# Patient Record
Sex: Female | Born: 2000 | Race: White | Hispanic: No | Marital: Single | Smoking: Never smoker
Health system: Southern US, Community
[De-identification: ages and names within clinical notes are randomized; demographics above are authoritative.]

## PROBLEM LIST (undated history)

## (undated) DIAGNOSIS — F909 Attention-deficit hyperactivity disorder, unspecified type: Secondary | ICD-10-CM

## (undated) DIAGNOSIS — J302 Other seasonal allergic rhinitis: Secondary | ICD-10-CM

## (undated) DIAGNOSIS — F812 Mathematics disorder: Secondary | ICD-10-CM

## (undated) DIAGNOSIS — G43909 Migraine, unspecified, not intractable, without status migrainosus: Secondary | ICD-10-CM

## (undated) DIAGNOSIS — F419 Anxiety disorder, unspecified: Secondary | ICD-10-CM

## (undated) DIAGNOSIS — M5416 Radiculopathy, lumbar region: Secondary | ICD-10-CM

## (undated) DIAGNOSIS — Z91018 Allergy to other foods: Secondary | ICD-10-CM

## (undated) DIAGNOSIS — F32A Depression, unspecified: Secondary | ICD-10-CM

## (undated) DIAGNOSIS — L83 Acanthosis nigricans: Secondary | ICD-10-CM

## (undated) DIAGNOSIS — Z87891 Personal history of nicotine dependence: Secondary | ICD-10-CM

## (undated) DIAGNOSIS — Z91010 Allergy to peanuts: Secondary | ICD-10-CM

## (undated) DIAGNOSIS — F329 Major depressive disorder, single episode, unspecified: Secondary | ICD-10-CM

## (undated) DIAGNOSIS — E669 Obesity, unspecified: Secondary | ICD-10-CM

## (undated) DIAGNOSIS — J309 Allergic rhinitis, unspecified: Secondary | ICD-10-CM

## (undated) DIAGNOSIS — N926 Irregular menstruation, unspecified: Secondary | ICD-10-CM

## (undated) DIAGNOSIS — F988 Other specified behavioral and emotional disorders with onset usually occurring in childhood and adolescence: Secondary | ICD-10-CM

## (undated) HISTORY — DX: Allergy to other foods: Z91.018

## (undated) HISTORY — DX: Radiculopathy, lumbar region: M54.16

## (undated) HISTORY — DX: Obesity, unspecified: E66.9

## (undated) HISTORY — DX: Allergy to peanuts: Z91.010

## (undated) HISTORY — DX: Personal history of nicotine dependence: Z87.891

## (undated) HISTORY — PX: WISDOM TOOTH EXTRACTION: SHX21

## (undated) HISTORY — DX: Acanthosis nigricans: L83

## (undated) HISTORY — PX: LUMBAR MICRODISCECTOMY: SHX99

## (undated) HISTORY — DX: Irregular menstruation, unspecified: N92.6

## (undated) HISTORY — DX: Mathematics disorder: F81.2

## (undated) HISTORY — DX: Other specified behavioral and emotional disorders with onset usually occurring in childhood and adolescence: F98.8

## (undated) HISTORY — DX: Allergic rhinitis, unspecified: J30.9

## (undated) NOTE — Progress Notes (Signed)
 Formatting of this note might be different from the original. Pt presents left eye redness and discharge. Also report congestion, productive cough, rhinorrhea x 2 days. Denies fever. Pt states she had same symptoms last month and was treated for respiratory infection. Reports she was seen twice and treated with doxycycline , oral prednisone , steroid inhaler, and steroid injection.  Hx of asthma. Electronically signed by Damien Batter, CMA at 05/22/2018 12:54 PM EST

## (undated) NOTE — Progress Notes (Signed)
 Formatting of this note is different from the original. Subjective:    Patient ID: Taylor Rose  MRN:  [id number] Date of Birth:  Feb 17, 2001  Patient is a 56 y.o. female who complains of:  Acute Visit . HPI  Pt presents left eye redness and discharge. Also report congestion, productive cough, rhinorrhea x 2 days. Denies fever. Pt states she had same symptoms last month and was treated for respiratory infection. Reports she was seen twice and treated with doxycycline , oral prednisone , steroid inhaler, and steroid injection.  Hx of asthma.  I have reviewed the patient's past medical history, social history, family history, medications and allergies, and made changes when deemed necessary.  Review of Systems  Constitutional: Negative for activity change, appetite change, chills, diaphoresis, fatigue, fever and unexpected weight change.  HENT: Positive for congestion and rhinorrhea. Negative for ear discharge, ear pain, facial swelling, hearing loss, postnasal drip, sinus pressure, sinus pain, sneezing, sore throat, tinnitus and voice change.   Eyes: Positive for discharge and redness. Negative for photophobia, pain, itching and visual disturbance.  Respiratory: Positive for cough and wheezing. Negative for chest tightness and shortness of breath.   Cardiovascular: Negative for chest pain, palpitations and leg swelling.  Gastrointestinal: Negative for abdominal pain, blood in stool, constipation, diarrhea, nausea and vomiting.  Genitourinary: Negative for dysuria, flank pain, frequency, hematuria and urgency.  Musculoskeletal: Negative for arthralgias, back pain, joint swelling, neck pain and neck stiffness.  Skin: Negative for rash.  Neurological: Negative for dizziness, tremors, speech difficulty, weakness, light-headedness, numbness and headaches.  Hematological: Negative for adenopathy. Does not bruise/bleed easily.  Psychiatric/Behavioral: Negative for confusion.  All other systems  reviewed and are negative.   Objective:   BP 116/80 (BP Location: Left upper arm, Patient Position: Sitting, BP Cuff Size: Large adult)   Pulse 80   Temp 98.1 F (36.7 C) (Oral)   Resp 14   Wt 196 lb (88.9 kg)   SpO2 99%   Physical Exam  Constitutional: Vital signs are normal. She appears well-developed and well-nourished.  Non-toxic appearance. She does not have a sickly appearance. She does not appear ill. No distress.  HENT:  Head: Normocephalic and atraumatic.  Right Ear: External ear normal. No mastoid tenderness. Tympanic membrane is not injected, not erythematous and not bulging. A middle ear effusion is present.  Left Ear: External ear normal. No mastoid tenderness. Tympanic membrane is not injected, not erythematous and not bulging. A middle ear effusion is present.  Nose: Nose normal. Right sinus exhibits no maxillary sinus tenderness and no frontal sinus tenderness. Left sinus exhibits no maxillary sinus tenderness and no frontal sinus tenderness.  Mouth/Throat: Uvula is midline, oropharynx is clear and moist and mucous membranes are normal. No oropharyngeal exudate, posterior oropharyngeal edema, posterior oropharyngeal erythema or tonsillar abscesses.  Post nasal drip   Eyes: Pupils are equal, round, and reactive to light. Conjunctivae and EOM are normal. Right eye exhibits no discharge. Left eye exhibits discharge. No scleral icterus.  Neck: Normal range of motion. Neck supple.  Cardiovascular: Normal rate, regular rhythm and normal heart sounds. Exam reveals no gallop and no friction rub.  No murmur heard. Pulmonary/Chest: Effort normal. No respiratory distress. She has wheezes. She has no rales.  Abdominal: Soft. Bowel sounds are normal. She exhibits no distension. There is no tenderness. There is no rebound and no guarding.  Lymphadenopathy:    She has no cervical adenopathy.  Neurological: She is alert. Gait normal.  Skin: Skin is warm and  dry. No rash noted. She is  not diaphoretic. No erythema.  Psychiatric: She has a normal mood and affect. Her behavior is normal. Judgment and thought content normal.  Nursing note and vitals reviewed.     Assessment & Plan    Conjunctivitis - will treat with erythromycin ointment today. No concerns for preseptal or orbital cellulitis. RTC or go to eye doctor with worsening s/s.   Asthma - will give trial of montelukast  today.    Electronically signed by Scot Toribio Gave, PA-C at 05/22/2018 12:54 PM EST

---
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
## 2018-05-08 ENCOUNTER — Ambulatory Visit (HOSPITAL_COMMUNITY)
Admission: EM | Admit: 2018-05-08 | Discharge: 2018-05-08 | Disposition: A | Discharge status: Home / Self Care | Payer: BC Managed Care – PPO | Attending: Physician Assistant | Admitting: Physician Assistant

## 2018-05-08 ENCOUNTER — Encounter (HOSPITAL_COMMUNITY): Payer: Self-pay

## 2018-05-08 DIAGNOSIS — J0140 Acute pansinusitis, unspecified: Secondary | ICD-10-CM | POA: Diagnosis not present

## 2018-05-08 DIAGNOSIS — J4521 Mild intermittent asthma with (acute) exacerbation: Secondary | ICD-10-CM

## 2018-05-08 MED ORDER — DOXYCYCLINE HYCLATE 100 MG PO CAPS: 100.0000 mg | ORAL_CAPSULE | Freq: Two times a day (BID) | ORAL | 0 refills | Status: DC

## 2018-05-08 MED ORDER — IPRATROPIUM BROMIDE 0.06 % NA SOLN: 2.0000 | Freq: Four times a day (QID) | NASAL | 0 refills | Status: DC

## 2018-05-08 MED ORDER — PREDNISONE 50 MG PO TABS: 50.0000 mg | ORAL_TABLET | Freq: Every day | ORAL | 0 refills | Status: DC

## 2018-05-08 NOTE — ED Triage Notes (Signed)
Pt presents with increase in asthma flare up in past week; pt states that she has had a bad cough that is unrelieved with medication.  Pt also complains of redness and irritation with left eye.

## 2018-05-08 NOTE — Discharge Instructions (Signed)
Start doxycycline and prednisone as directed. Start atrovent nasal spray for nasal congestion/drainage. You can use over the counter nasal saline rinse such as neti pot for nasal congestion. Keep hydrated, your urine should be clear to pale yellow in color. Tylenol/motrin for fever and pain. Monitor for any worsening of symptoms, chest pain, shortness of breath, wheezing, swelling of the throat, follow up for reevaluation.   As discussed, your eye redness could be due to current respiratory infection. Warm compress to the eye and monitor symptoms.  Monitor for any worsening of symptoms, changes in vision, sensitivity to light, eye swelling, painful eye movement, follow up with ophthalmology for further evaluation.

## 2018-05-08 NOTE — ED Provider Notes (Signed)
[facility]    CSN: [id number] Arrival date & time: 05/08/18  1200     History   Chief Complaint Chief Complaint  Patient presents with  . Asthma  . Conjunctivitis  . Cough    HPI Taylor Rose is a 18 y.o. female.   18 year old female with history of asthma comes in for continued URI symptoms and asthma exacerbation. Patient was seen at an urgency care 12/20 for similar symptoms, at that time exam was consistent with viral URI and was given tessalon for symptoms.  Patient has been taking as directed without much improvement.  She continues to have rhinorrhea, nasal congestion.  States cough periodically at nighttime.  Denies fever, chills, night sweats.  She has had to increase use of albuterol inhaler, but this completely resolves wheezing or shortness of breath.  She woke up this morning with left eye redness and crusting.  Denies eye pain, irritation.  Denies photophobia, vision changes.  Denies contact lens, glasses use.     Past Medical History:  Diagnosis Date  . Asthma   . Migraine   . Nut allergy   . Seasonal allergies     Patient Active Problem List   Diagnosis Date Noted  . 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 Migraine without aura and without status migrainosus, not intractable 11/12/2014  . Episodic tension-type headache, not intractable 11/12/2014  . 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 Irregular periods/menstrual cycles 11/12/2014  . Acanthosis nigricans 11/12/2014  . Obesity 11/12/2014    History reviewed. No pertinent surgical history.  OB History   No obstetric history on file.      Home Medications    Prior to Admission medications   Medication Sig Start Date End Date Taking? Authorizing Provider  albuterol (PROVENTIL HFA) 108 (90 Base) MCG/ACT inhaler Inhale 2 puffs into the lungs every 6 (six) hours as needed for wheezing or shortness of breath. 07/17/15   Kozlow, Alvira Philips,  MD  albuterol (PROVENTIL) (2.5 MG/3ML) 0.083% nebulizer solution Take 3 mLs (2.5 mg total) by nebulization every 4 (four) hours as needed for wheezing. 05/08/17   Alene Mires, NP  aspirin-acetaminophen-caffeine (EXCEDRIN MIGRAINE) [phone] MG per tablet Take 1 tablet by mouth every 6 (six) hours as needed for headache or migraine.    [provider]  cetirizine (ZYRTEC) 10 MG tablet Take 10 mg by mouth daily.    [provider]  diphenhydrAMINE (BENADRYL) 50 MG capsule Take 1 cap PO Q6h x 1-2 days then Q6h prn 03/23/15   Lowanda Foster, NP  doxycycline (VIBRAMYCIN) 100 MG capsule Take 1 capsule (100 mg total) by mouth 2 (two) times daily. 05/08/18   Cathie Hoops, Alece Koppel V, PA-C  EPINEPHrine (EPIPEN 2-PAK) 0.3 mg/0.3 mL IJ SOAJ injection Inject 0.3 mLs (0.3 mg total) into the muscle once. 07/17/15   Kozlow, Alvira Philips, MD  etonogestrel (NEXPLANON) 68 MG IMPL implant 1 each by Subdermal route once. 12/30/14   [provider]  famotidine (PEPCID) 40 MG tablet Take 1 tablet (40 mg total) by mouth daily. X 4 days then QD prn 03/23/15   Lowanda Foster, NP  Ibuprofen 200 MG CAPS Take 1 capsule (200 mg total) by mouth 3 (three) times daily with meals as needed. 12/01/12   Moreno-Coll, Adlih, MD  ipratropium (ATROVENT) 0.06 % nasal spray Place 2 sprays into both nostrils 4 (four) times daily. 05/08/18   Belinda Fisher, PA-C  methylphenidate 36 MG PO CR tablet  01/18/15   [provider]  ondansetron (ZOFRAN) 4 MG tablet Take 1 tablet (4 mg total) by mouth every 6 (six) hours. 05/08/17   Alene Mires, NP  predniSONE (DELTASONE) 50 MG tablet Take 1 tablet (50 mg total) by mouth daily. 05/08/18   Belinda Fisher, PA-C  topiramate (TOPAMAX) 25 MG tablet Take one tablet at nighttime for one week then 2 tablets at nighttime 01/30/15   Deetta Perla, MD    Family History History reviewed. No pertinent family history.  Social History Social History   Tobacco Use  . Smoking status: Never  Smoker  . Smokeless tobacco: Never Used  Substance Use Topics  . Alcohol use: No  . Drug use: No     Allergies   Peanuts [peanut oil]; Amoxicillin; and Penicillins   Review of Systems Review of Systems  Reason unable to perform ROS: See HPI as above.     Physical Exam Triage Vital Signs ED Triage Vitals  Enc Vitals Group     BP 05/08/18 1304 (!) 146/95     Pulse Rate 05/08/18 1304 82     Resp 05/08/18 1304 18     Temp 05/08/18 1304 98 F (36.7 C)     Temp Source 05/08/18 1304 Oral     SpO2 05/08/18 1304 91 %     Weight --      Height --      Head Circumference --      Peak Flow --      Pain Score 05/08/18 1306 5     Pain Loc --      Pain Edu? --      Excl. in GC? --    No data found.  Updated Vital Signs BP (!) 146/95 (BP Location: Right Arm)   Pulse 82   Temp 98 F (36.7 C) (Oral)   Resp 18   SpO2 98%   Physical Exam Constitutional:      General: She is not in acute distress.    Appearance: She is well-developed. She is not ill-appearing, toxic-appearing or diaphoretic.  HENT:     Head: Normocephalic and atraumatic.     Right Ear: Tympanic membrane, ear canal and external ear normal. Tympanic membrane is not erythematous or bulging.     Left Ear: Tympanic membrane, ear canal and external ear normal. Tympanic membrane is not erythematous or bulging.     Nose: Congestion and rhinorrhea present.     Right Sinus: No maxillary sinus tenderness or frontal sinus tenderness.     Left Sinus: No maxillary sinus tenderness or frontal sinus tenderness.     Mouth/Throat:     Pharynx: Uvula midline.  Eyes:     General: Lids are normal. Lids are everted, no foreign bodies appreciated.     Extraocular Movements: Extraocular movements intact.     Conjunctiva/sclera:     Right eye: Right conjunctiva is not injected.     Left eye: Left conjunctiva is injected.     Pupils: Pupils are equal, round, and reactive to light.  Neck:     Musculoskeletal: Normal range of  motion and neck supple.  Cardiovascular:     Rate and Rhythm: Normal rate and regular rhythm.     Heart sounds: Normal heart sounds. No murmur. No friction rub. No gallop.   Pulmonary:     Effort: Pulmonary effort is normal.     Breath sounds: Normal breath sounds. No decreased breath sounds, wheezing, rhonchi or rales.  Lymphadenopathy:     Cervical: No cervical adenopathy.  Skin:    General: Skin is warm and dry.  Neurological:     Mental Status: She is alert and oriented to person, place, and time.  Psychiatric:        Behavior: Behavior normal.        Judgment: Judgment normal.      UC Treatments / Results  Labs (all labs ordered are listed, but only abnormal results are displayed) Labs Reviewed - No data to display  EKG None  Radiology No results found.  Procedures Procedures (including critical care time)  Medications Ordered in UC Medications - No data to display  Initial Impression / Assessment and Plan / UC Course  I have reviewed the triage vital signs and the nursing notes.  Pertinent labs & imaging results that were available during my care of the patient were reviewed by me and considered in my medical decision making (see chart for details).    Patient initial triage with O2 saturation of 91% at room air.  At this time, patient was speaking in full sentences without difficulty and denies shortness of breath and wheezing.  Recheck oxygen saturation at 98% at room air.  Patient with acrylic nails, discussed with patient nails could be obscuring providers.  Lungs are clear to auscultation bilaterally without adventitious lung sounds.  No signs of respiratory distress.  Will cover for sinusitis given 2 to 3-week history of symptoms.  Doxycycline as directed.  Other symptomatic treatment discussed.  Will provide prednisone for asthma exacerbation.  Discussed with patient, eye redness could be due to current respiratory infection, will have patient continue to  monitor for now.  Return precautions given.  Patient expresses understanding and agrees to plan.  Final Clinical Impressions(s) / UC Diagnoses   Final diagnoses:  Acute non-recurrent pansinusitis  Mild intermittent asthma with acute exacerbation    ED Prescriptions    Medication Sig Dispense Auth. Provider   predniSONE (DELTASONE) 50 MG tablet Take 1 tablet (50 mg total) by mouth daily. 5 tablet Jeiry Birnbaum V, PA-C   doxycycline (VIBRAMYCIN) 100 MG capsule Take 1 capsule (100 mg total) by mouth 2 (two) times daily. 20 capsule Divante Kotch V, PA-C   ipratropium (ATROVENT) 0.06 % nasal spray Place 2 sprays into both nostrils 4 (four) times daily. 15 mL Threasa AlphaYu, Rhett Mutschler V, PA-C        Tyronn Golda V, PA-C 05/08/18 1400

## 2018-05-13 ENCOUNTER — Encounter (HOSPITAL_COMMUNITY): Payer: Self-pay

## 2018-05-13 ENCOUNTER — Ambulatory Visit (HOSPITAL_COMMUNITY)
Admission: EM | Admit: 2018-05-13 | Discharge: 2018-05-13 | Disposition: A | Discharge status: Home / Self Care | Payer: BC Managed Care – PPO | Attending: Family Medicine | Admitting: Family Medicine

## 2018-05-13 DIAGNOSIS — J4541 Moderate persistent asthma with (acute) exacerbation: Secondary | ICD-10-CM | POA: Diagnosis not present

## 2018-05-13 MED ORDER — METHYLPREDNISOLONE ACETATE 80 MG/ML IJ SUSP
INTRAMUSCULAR | Status: AC
  Filled 2018-05-13: qty 1

## 2018-05-13 MED ORDER — METHYLPREDNISOLONE ACETATE 80 MG/ML IJ SUSP
80.0000 mg | Freq: Once | INTRAMUSCULAR | Status: AC
  Administered 2018-05-13: 80 mg via INTRAMUSCULAR

## 2018-05-13 MED ORDER — FLUTICASONE PROPIONATE HFA 110 MCG/ACT IN AERO: 2.0000 | INHALATION_SPRAY | Freq: Two times a day (BID) | RESPIRATORY_TRACT | 12 refills | Status: DC | PRN

## 2018-05-13 NOTE — ED Provider Notes (Signed)
[facility]    CSN: [id number] Arrival date & time: 05/13/18  1437     History   Chief Complaint Chief Complaint  Patient presents with  . Appointment    245  . Cough    HPI Taylor Rose is a 18 y.o. female.   This a 18 year old woman who comes in 5 days after her last visit.  She stating that she completed her antibiotics and is concerned about returning symptoms.    Please see note from 05/08/2018: 18 year old female with history of asthma comes in for continued URI symptoms and asthma exacerbation. Patient was seen at an urgency care 12/20 for similar symptoms, at that time exam was consistent with viral URI and was given tessalon for symptoms.  Patient has been taking as directed without much improvement.  She continues to have rhinorrhea, nasal congestion.  States cough periodically at nighttime.  Denies fever, chills, night sweats.  She has had to increase use of albuterol inhaler, but this completely resolves wheezing or shortness of breath.  She woke up this morning with left eye redness and crusting.  Denies eye pain, irritation.  Denies photophobia, vision changes.  Denies contact lens, glasses use.     Past Medical History:  Diagnosis Date  . Asthma   . Migraine   . Nut allergy   . Seasonal allergies     Patient Active Problem List   Diagnosis Date Noted  . 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 Migraine without aura and without status migrainosus, not intractable 11/12/2014  . Episodic tension-type headache, not intractable 11/12/2014  . 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 Irregular periods/menstrual cycles 11/12/2014  . Acanthosis nigricans 11/12/2014  . Obesity 11/12/2014    History reviewed. No pertinent surgical history.  OB History   No obstetric history on file.      Home Medications    Prior to Admission medications   Medication Sig Start Date End Date Taking? Authorizing  Provider  albuterol (PROVENTIL HFA) 108 (90 Base) MCG/ACT inhaler Inhale 2 puffs into the lungs every 6 (six) hours as needed for wheezing or shortness of breath. 07/17/15   Kozlow, Alvira Philips, MD  albuterol (PROVENTIL) (2.5 MG/3ML) 0.083% nebulizer solution Take 3 mLs (2.5 mg total) by nebulization every 4 (four) hours as needed for wheezing. 05/08/17   Alene Mires, NP  aspirin-acetaminophen-caffeine (EXCEDRIN MIGRAINE) [id number] MG per tablet Take 1 tablet by mouth every 6 (six) hours as needed for headache or migraine.    [provider]  diphenhydrAMINE (BENADRYL) 50 MG capsule Take 1 cap PO Q6h x 1-2 days then Q6h prn 03/23/15   Lowanda Foster, NP  EPINEPHrine (EPIPEN 2-PAK) 0.3 mg/0.3 mL IJ SOAJ injection Inject 0.3 mLs (0.3 mg total) into the muscle once. 07/17/15   Kozlow, Alvira Philips, MD  etonogestrel (NEXPLANON) 68 MG IMPL implant 1 each by Subdermal route once. 12/30/14   [provider]  fluticasone (FLOVENT HFA) 110 MCG/ACT inhaler Inhale 2 puffs into the lungs 2 (two) times daily as needed. 05/13/18   Elvina Sidle, MD  Ibuprofen 200 MG CAPS Take 1 capsule (200 mg total) by mouth 3 (three) times daily with meals as needed. 12/01/12   Moreno-Coll, Adlih, MD  methylphenidate 36 MG PO CR tablet  01/18/15   [provider]    Family History History reviewed. No pertinent family history.  Social History Social History   Tobacco Use  . Smoking status: Never Smoker  .  Smokeless tobacco: Never Used  Substance Use Topics  . Alcohol use: No  . Drug use: No     Allergies   Peanuts [peanut oil]; Amoxicillin; and Penicillins   Review of Systems Review of Systems   Physical Exam Triage Vital Signs ED Triage Vitals [05/13/18 1452]  Enc Vitals Group     BP 128/81     Pulse Rate 99     Resp 18     Temp (!) 97 F (36.1 C)     Temp Source Oral     SpO2 98 %     Weight      Height      Head Circumference      Peak Flow      Pain Score 0     Pain Loc       Pain Edu?      Excl. in GC?    No data found.  Updated Vital Signs BP 128/81 (BP Location: Right Arm)   Pulse 99   Temp (!) 97 F (36.1 C) (Oral)   Resp 18   SpO2 98%    Physical Exam Vitals signs and nursing note reviewed.  Constitutional:      Appearance: Normal appearance.  HENT:     Head: Normocephalic.     Nose: Congestion present.     Mouth/Throat:     Mouth: Mucous membranes are moist.     Pharynx: Oropharynx is clear.  Eyes:     Conjunctiva/sclera: Conjunctivae normal.     Pupils: Pupils are equal, round, and reactive to light.  Cardiovascular:     Heart sounds: Normal heart sounds.  Pulmonary:     Breath sounds: Wheezing present.  Musculoskeletal: Normal range of motion.  Skin:    General: Skin is warm and dry.  Neurological:     General: No focal deficit present.     Mental Status: She is alert and oriented to person, place, and time.      UC Treatments / Results  Labs (all labs ordered are listed, but only abnormal results are displayed) Labs Reviewed - No data to display  EKG None  Radiology No results found.  Procedures Procedures (including critical care time)  Medications Ordered in UC Medications  methylPREDNISolone acetate (DEPO-MEDROL) injection 80 mg (has no administration in time range)    Initial Impression / Assessment and Plan / UC Course  I have reviewed the triage vital signs and the nursing notes.  Pertinent labs & imaging results that were available during my care of the patient were reviewed by me and considered in my medical decision making (see chart for details).     Final Clinical Impressions(s) / UC Diagnoses   Final diagnoses:  Moderate persistent asthma with exacerbation   Discharge Instructions   None    ED Prescriptions    Medication Sig Dispense Auth. Provider   fluticasone (FLOVENT HFA) 110 MCG/ACT inhaler Inhale 2 puffs into the lungs 2 (two) times daily as needed. 1 Inhaler Elvina Sidle,  MD     Controlled Substance Prescriptions [place] Controlled Substance Registry consulted? Not Applicable   Elvina Sidle, MD 05/13/18 [phone]

## 2018-05-13 NOTE — ED Triage Notes (Signed)
Pt states tx'd for a URI last week, completed meds yesterday and now her sx's are back; c/o asthma flare up and cough

## 2018-12-01 ENCOUNTER — Other Ambulatory Visit: Payer: Self-pay

## 2018-12-01 DIAGNOSIS — Z20822 Contact with and (suspected) exposure to covid-19: Secondary | ICD-10-CM

## 2018-12-02 ENCOUNTER — Other Ambulatory Visit: Payer: Self-pay

## 2018-12-04 LAB — NOVEL CORONAVIRUS, NAA: SARS-CoV-2, NAA: NOT DETECTED
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
## 2019-04-29 ENCOUNTER — Other Ambulatory Visit: Payer: Self-pay

## 2019-04-29 ENCOUNTER — Telehealth (HOSPITAL_COMMUNITY): Payer: Self-pay | Admitting: Urgent Care

## 2019-04-29 ENCOUNTER — Encounter (HOSPITAL_COMMUNITY): Payer: Self-pay | Admitting: *Deleted

## 2019-04-29 ENCOUNTER — Ambulatory Visit (HOSPITAL_COMMUNITY)
Admission: EM | Admit: 2019-04-29 | Discharge: 2019-04-29 | Disposition: A | Discharge status: Home / Self Care | Payer: BC Managed Care – PPO

## 2019-04-29 DIAGNOSIS — Z8616 Personal history of COVID-19: Secondary | ICD-10-CM

## 2019-04-29 DIAGNOSIS — J4540 Moderate persistent asthma, uncomplicated: Secondary | ICD-10-CM | POA: Diagnosis not present

## 2019-04-29 DIAGNOSIS — J3089 Other allergic rhinitis: Secondary | ICD-10-CM

## 2019-04-29 HISTORY — DX: Depression, unspecified: F32.A

## 2019-04-29 HISTORY — DX: Anxiety disorder, unspecified: F41.9

## 2019-04-29 MED ORDER — ALBUTEROL SULFATE (2.5 MG/3ML) 0.083% IN NEBU: 2.5000 mg | INHALATION_SOLUTION | RESPIRATORY_TRACT | 0 refills | Status: AC | PRN

## 2019-04-29 MED ORDER — PREDNISONE 20 MG PO TABS: ORAL_TABLET | ORAL | 0 refills | Status: DC

## 2019-04-29 MED ORDER — ALBUTEROL SULFATE (2.5 MG/3ML) 0.083% IN NEBU: 2.5000 mg | INHALATION_SOLUTION | RESPIRATORY_TRACT | 0 refills | Status: DC | PRN

## 2019-04-29 MED ORDER — NEBULIZER/TUBING/MOUTHPIECE KIT: PACK | 0 refills | Status: AC

## 2019-04-29 NOTE — ED Provider Notes (Signed)
[place]   MRN: [id number] DOB: October 27, 2000  Subjective:   Taylor Rose is a 18 y.o. female presenting for several day history of recurrent wheezing and sinus congestion.  Patient usually has a difficult time during the season.  She has a history of allergic rhinitis, moderate persistent asthma.  She is not currently using her [place].  She also ran out of her nebulized albuterol.  She is using her albuterol inhaler 2-3 times a day.  Of note, patient had Covid at the end of September 2020 and recovered well.  Patient got tested for COVID-19 again 2 days ago and was negative.  No current facility-administered medications for this encounter.  Current Outpatient Medications:  .  albuterol (PROVENTIL HFA) 108 (90 Base) MCG/ACT inhaler, Inhale 2 puffs into the lungs every 6 (six) hours as needed for wheezing or shortness of breath., Disp: 1 Inhaler, Rfl: 1 .  venlafaxine (EFFEXOR) 75 MG tablet, Take 75 mg by mouth daily., Disp: , Rfl:  .  albuterol (PROVENTIL) (2.5 MG/3ML) 0.083% nebulizer solution, Take 3 mLs (2.5 mg total) by nebulization every 4 (four) hours as needed for wheezing., Disp: 75 vial, Rfl: 3 .  aspirin-acetaminophen-caffeine (EXCEDRIN MIGRAINE) 250-250-65 MG per tablet, Take 1 tablet by mouth every 6 (six) hours as needed for headache or migraine., Disp: , Rfl:  .  diphenhydrAMINE (BENADRYL) 50 MG capsule, Take 1 cap PO Q6h x 1-2 days then Q6h prn, Disp: 30 capsule, Rfl: 0 .  EPINEPHrine (EPIPEN 2-PAK) 0.3 mg/0.3 mL IJ SOAJ injection, Inject 0.3 mLs (0.3 mg total) into the muscle once., Disp: 2 Device, Rfl: 1 .  etonogestrel (NEXPLANON) 68 MG IMPL implant, 1 each by Subdermal route once., Disp: , Rfl:  .  fluticasone (FLOVENT HFA) 110 MCG/ACT inhaler, Inhale 2 puffs into the lungs 2 (two) times daily as needed., Disp: 1 Inhaler, Rfl: 12 .  Ibuprofen 200 MG CAPS, Take 1 capsule (200 mg total) by mouth 3 (three) times daily with meals as needed., Disp: 15  capsule, Rfl: 0 .  methylphenidate 36 MG PO CR tablet, , Disp: , Rfl:     Allergies  Allergen Reactions  . Peanuts [Peanut Oil] Nausea And Vomiting    Also allergic to tree nuts.  . Amoxicillin Rash  . Penicillins Rash    Past Medical History:  Diagnosis Date  . Anxiety   . Asthma   . Depression   . Migraine   . Nut allergy   . Seasonal allergies      Past Surgical History:  Procedure Laterality Date  . LUMBAR MICRODISCECTOMY      Family History  Problem Relation Age of Onset  . Diabetes Mother   . Healthy Father     Social History   Tobacco Use  . Smoking status: Never Smoker  . Smokeless tobacco: Never Used  Substance Use Topics  . Alcohol use: No  . Drug use: No    Review of Systems  Constitutional: Negative for fever and malaise/fatigue.  HENT: Positive for congestion. Negative for ear pain, sinus pain and sore throat.   Eyes: Negative for discharge and redness.  Respiratory: Positive for wheezing. Negative for cough, hemoptysis and shortness of breath.   Cardiovascular: Negative for chest pain.  Gastrointestinal: Negative for abdominal pain, diarrhea, nausea and vomiting.  Genitourinary: Negative for dysuria, flank pain and hematuria.  Musculoskeletal: Negative for myalgias.  Skin: Negative for rash.  Neurological: Negative for dizziness, weakness and headaches.  Psychiatric/Behavioral: Negative for depression  and substance abuse.     Objective:   Vitals: BP 138/82   Pulse 81   Temp 97.8 F (36.6 C) (Oral)   Resp 18   SpO2 97%   Physical Exam Constitutional:      General: She is not in acute distress.    Appearance: Normal appearance. She is well-developed. She is not ill-appearing, toxic-appearing or diaphoretic.  HENT:     Head: Normocephalic and atraumatic.     Nose: Congestion and rhinorrhea present.     Mouth/Throat:     Mouth: Mucous membranes are moist.     Comments: PND in oropharynx. Eyes:     Extraocular Movements:  Extraocular movements intact.     Pupils: Pupils are equal, round, and reactive to light.  Cardiovascular:     Rate and Rhythm: Normal rate and regular rhythm.     Pulses: Normal pulses.     Heart sounds: Normal heart sounds. No murmur. No friction rub. No gallop.   Pulmonary:     Effort: Pulmonary effort is normal. No respiratory distress.     Breath sounds: Normal breath sounds. No stridor. No wheezing, rhonchi or rales.  Skin:    General: Skin is warm and dry.     Findings: No rash.  Neurological:     Mental Status: She is alert and oriented to person, place, and time.  Psychiatric:        Mood and Affect: Mood normal.        Behavior: Behavior normal.        Thought Content: Thought content normal.        Judgment: Judgment normal.     Assessment and Plan :   1. Moderate persistent asthma without complication   2. Allergic rhinitis due to other allergic trigger, unspecified seasonality   3. History of 2019 novel coronavirus disease (COVID-19)     Will use prednisone course for rhinitis, asthma flare.  Refilled her nebulized albuterol.  Maintain all other medications, restart Singulair. Counseled patient on potential for adverse effects with medications prescribed/recommended today, ER and return-to-clinic precautions discussed, patient verbalized understanding.    Wallis Bamberg, PA-C 04/29/19 1304

## 2019-04-29 NOTE — ED Triage Notes (Signed)
Pt reports runny nose and nasal congestion with wheezing over past few days.  States normally has asthma flare-ups this time of year.  States had Covid test done 2 days ago - told negative.  Has been using inhaler with some relief.

## 2019-04-29 NOTE — Telephone Encounter (Signed)
Patient also requested a prescription for nebulizer as she forgot the one that she normally uses at her school and will be unable to retrieve it.

## 2022-10-02 ENCOUNTER — Other Ambulatory Visit (HOSPITAL_BASED_OUTPATIENT_CLINIC_OR_DEPARTMENT_OTHER): Payer: Self-pay

## 2022-10-03 ENCOUNTER — Other Ambulatory Visit (HOSPITAL_BASED_OUTPATIENT_CLINIC_OR_DEPARTMENT_OTHER): Payer: Self-pay

## 2022-10-05 ENCOUNTER — Other Ambulatory Visit (HOSPITAL_BASED_OUTPATIENT_CLINIC_OR_DEPARTMENT_OTHER): Payer: Self-pay

## 2022-10-06 ENCOUNTER — Other Ambulatory Visit (HOSPITAL_BASED_OUTPATIENT_CLINIC_OR_DEPARTMENT_OTHER): Payer: Self-pay

## 2023-05-01 NOTE — Progress Notes (Signed)
 Subjective Patient ID: Taylor Rose is a 22 y.o. female.  Chief Complaint  Patient presents with  . Asthma  . Rash    Has asthma and her  lungs hurt. Having tightness with wheezing. Needs refills on inhaler and neb meds. Grandmother has pneumonia. No fever. No NVD. Eating and drinking fine. Also has a rash is all over her body but worse on her arms and hands. Causing painful swelling in hands. Takes benadryl  and it helps the itching but started back once meds wear off      The following information was reviewed by members of the visit team:  Tobacco  Allergies  Meds  Med Hx  Surg Hx  Fam Hx  Soc Hx     22 year old female presents for evaluation of cough, wheezing and mild shortness of breath.  Symptoms started 2 days ago.  States she does have a history of asthma and has run out of her albuterol  inhaler and solution for her nebulizer machine at home.  Current symptoms feel consistent with previous asthma exacerbations.  She denies associated fever, chills, body aches.  No recent congestion, rhinorrhea, sore throat.  Her cough has been nonproductive.  States she has been exposed to her grandmother who has been pneumonia.  Also reports intermittent skin rash over the past few days.  Rash is on hands, sides, upper legs and has been pruritic.  Rash responds to Benadryl  but returns when the medication wears off.  She denies known exposures to irritants.    Review of Systems  Constitutional:  Negative for chills and fever.  HENT:  Negative for congestion, ear pain, rhinorrhea, sinus pressure, sinus pain and sore throat.   Respiratory:  Positive for cough, shortness of breath and wheezing. Negative for chest tightness.   Cardiovascular:  Negative for chest pain.  Gastrointestinal:  Negative for nausea and vomiting.  Musculoskeletal:  Negative for arthralgias and myalgias.  Skin:  Positive for rash.  Neurological:  Negative for dizziness and headaches.  Hematological:  Negative for  adenopathy. Does not bruise/bleed easily.    Objective Physical Exam Vitals reviewed.  Constitutional:      General: She is not in acute distress.    Appearance: Normal appearance. She is not ill-appearing, toxic-appearing or diaphoretic.  HENT:     Right Ear: Tympanic membrane, ear canal and external ear normal.     Left Ear: Tympanic membrane, ear canal and external ear normal.     Nose: Nose normal. No rhinorrhea.     Mouth/Throat:     Mouth: Mucous membranes are moist.     Pharynx: No oropharyngeal exudate or posterior oropharyngeal erythema.  Eyes:     Conjunctiva/sclera: Conjunctivae normal.  Cardiovascular:     Rate and Rhythm: Normal rate and regular rhythm.     Pulses: Normal pulses.  Pulmonary:     Effort: Pulmonary effort is normal. No respiratory distress.     Breath sounds: No stridor. Wheezing present. No rhonchi or rales.  Skin:    General: Skin is warm and dry.     Findings: Rash (Mild urticarial rash on hands, forearms) present.  Neurological:     Mental Status: She is alert and oriented to person, place, and time.  Psychiatric:        Mood and Affect: Mood normal.     Assessment/Plan 22 year old female asthmatic with history of wheezing, cough, shortness of breath for the past 2 days.  Also with skin rash Physical exam reveals mild wheezing, no rhonchi or  sounds of pneumonia.  Vital signs are within normal limits, patient afebrile and O2 sat is 97% on room air.  She is in no acute distress.  Rash appears consistent with atopic dermatitis.  There is no sign of bacterial infection. Prescription for prednisone  that will help with bronchospasm and with rash.  Will also provide refill for albuterol  inhaler and nebulizer solution.  Recommend follow-up with PCP or return to clinic as needed for persistent symptoms.  Go to the emergency department as needed for worsening shortness of breath, persistent rash or other worsening symptoms. Urgent Care Disposition:  Home  Care   Electronically signed: Franky Floria Finder, PA-C 05/01/2023  10:01 AM

## 2023-06-09 NOTE — Telephone Encounter (Signed)
 Patient called in requesting 2nd opinion with Dr. Wallene. Patient states she has been to [facility], was told she needs a spinal fusion. I advised patient we will need her records and imaging to be sent over for review with Dr. Wallene. Patient voiced understanding, fax number verbally provided to patient

## 2024-02-09 ENCOUNTER — Other Ambulatory Visit: Payer: Self-pay | Admitting: Neurological Surgery

## 2024-02-24 ENCOUNTER — Encounter: Payer: Self-pay | Admitting: Vascular Surgery

## 2024-02-28 ENCOUNTER — Other Ambulatory Visit: Payer: Self-pay

## 2024-03-07 ENCOUNTER — Encounter: Payer: Self-pay | Admitting: Vascular Surgery

## 2024-03-07 ENCOUNTER — Telehealth (HOSPITAL_COMMUNITY): Payer: Self-pay

## 2024-03-07 DIAGNOSIS — J4530 Mild persistent asthma, uncomplicated: Secondary | ICD-10-CM | POA: Insufficient documentation

## 2024-03-07 DIAGNOSIS — J309 Allergic rhinitis, unspecified: Secondary | ICD-10-CM | POA: Insufficient documentation

## 2024-03-07 DIAGNOSIS — J3081 Allergic rhinitis due to animal (cat) (dog) hair and dander: Secondary | ICD-10-CM | POA: Insufficient documentation

## 2024-03-07 DIAGNOSIS — J45909 Unspecified asthma, uncomplicated: Secondary | ICD-10-CM | POA: Insufficient documentation

## 2024-03-07 DIAGNOSIS — Z91018 Allergy to other foods: Secondary | ICD-10-CM | POA: Insufficient documentation

## 2024-03-07 DIAGNOSIS — Z87891 Personal history of nicotine dependence: Secondary | ICD-10-CM | POA: Insufficient documentation

## 2024-03-07 DIAGNOSIS — J301 Allergic rhinitis due to pollen: Secondary | ICD-10-CM | POA: Insufficient documentation

## 2024-03-07 DIAGNOSIS — M51369 Other intervertebral disc degeneration, lumbar region without mention of lumbar back pain or lower extremity pain: Secondary | ICD-10-CM | POA: Insufficient documentation

## 2024-03-07 DIAGNOSIS — G43909 Migraine, unspecified, not intractable, without status migrainosus: Secondary | ICD-10-CM | POA: Insufficient documentation

## 2024-03-07 DIAGNOSIS — J302 Other seasonal allergic rhinitis: Secondary | ICD-10-CM | POA: Insufficient documentation

## 2024-03-07 DIAGNOSIS — M5416 Radiculopathy, lumbar region: Secondary | ICD-10-CM | POA: Insufficient documentation

## 2024-03-07 DIAGNOSIS — H1045 Other chronic allergic conjunctivitis: Secondary | ICD-10-CM | POA: Insufficient documentation

## 2024-03-07 DIAGNOSIS — Z91010 Allergy to peanuts: Secondary | ICD-10-CM | POA: Insufficient documentation

## 2024-03-07 NOTE — Telephone Encounter (Addendum)
 Opened in error

## 2024-03-07 NOTE — Progress Notes (Signed)
 Surgical Instructions   Your procedure is scheduled on March 13, 2024. Report to [facility] Main Entrance A at 5:30 A.M., then check in with the Admitting office. Any questions or running late day of surgery: call [phone]  Questions prior to your surgery date: call [phone], Monday-Friday, 8am-4pm. If you experience any cold or flu symptoms such as cough, fever, chills, shortness of breath, etc. between now and your scheduled surgery, please notify us  at the above number.     Remember:  Do not eat or drink after midnight the night before your surgery buPROPion (WELLBUTRIN XL)  DULoxetine (CYMBALTA)     Take these medicines the morning of surgery with A SIP OF WATER    May take these medicines IF NEEDED: albuterol  (PROVENTIL  HFA)  MCG/ACT inhaler  albuterol  (PROVENTIL ) nebulizer solution      One week prior to surgery, STOP taking any Aspirin (unless otherwise instructed by your surgeon) Aleve, Naproxen, Ibuprofen , Motrin , Advil , Goody's, BC's, all herbal medications, fish oil, and non-prescription vitamins.                     Do NOT Smoke (Tobacco/Vaping) for 24 hours prior to your procedure.  If you use a CPAP at night, you may bring your mask/headgear for your overnight stay.   You will be asked to remove any contacts, glasses, piercing's, hearing aid's, dentures/partials prior to surgery. Please bring cases for these items if needed.    Patients discharged the day of surgery will not be allowed to drive home, and someone needs to stay with them for 24 hours.  SURGICAL WAITING ROOM VISITATION Patients may have no more than 2 support people in the waiting area - these visitors may rotate.   Pre-op nurse will coordinate an appropriate time for 1 ADULT support person, who may not rotate, to accompany patient in pre-op.  Children under the age of 91 must have an adult with them who is not the patient and must remain in the main waiting area with an adult.  If  the patient needs to stay at the hospital during part of their recovery, the visitor guidelines for inpatient rooms apply.  Please refer to the [facility] website for the visitor guidelines for any additional information.   If you received a COVID test during your pre-op visit  it is requested that you wear a mask when out in public, stay away from anyone that may not be feeling well and notify your surgeon if you develop symptoms. If you have been in contact with anyone that has tested positive in the last 10 days please notify you surgeon.      Pre-operative CHG Bathing Instructions   You can play a key role in reducing the risk of infection after surgery. Your skin needs to be as free of germs as possible. You can reduce the number of germs on your skin by washing with CHG (chlorhexidine gluconate) soap before surgery. CHG is an antiseptic soap that kills germs and continues to kill germs even after washing.   DO NOT use if you have an allergy  to chlorhexidine/CHG or antibacterial soaps. If your skin becomes reddened or irritated, stop using the CHG and notify one of our RNs at [phone].              TAKE A SHOWER THE NIGHT BEFORE SURGERY   Please keep in mind the following:  DO NOT shave, including legs and underarms, 48 hours prior to surgery.  You may shave your face before/day of surgery.  Place clean sheets on your bed the night before surgery Use a clean washcloth (not used since being washed) for shower. DO NOT sleep with pet's night before surgery.  CHG Shower Instructions:  Wash your face and private area with normal soap. If you choose to wash your hair, wash first with your normal shampoo.  After you use shampoo/soap, rinse your hair and body thoroughly to remove shampoo/soap residue.  Turn the water OFF and apply half the bottle of CHG soap to a CLEAN washcloth.  Apply CHG soap ONLY FROM YOUR NECK DOWN TO YOUR TOES (washing for 3-5 minutes)  DO NOT use CHG soap on  face, private areas, open wounds, or sores.  Pay special attention to the area where your surgery is being performed.  If you are having back surgery, having someone wash your back for you may be helpful. Wait 2 minutes after CHG soap is applied, then you may rinse off the CHG soap.  Pat dry with a clean towel  Put on clean pajamas    Additional instructions for the day of surgery: If you choose, you may shower the morning of surgery with an antibacterial soap.  DO NOT APPLY any lotions, deodorants, cologne, or perfumes.   Do not wear jewelry or makeup Do not wear nail polish, gel polish, artificial nails, or any other type of covering on natural nails (fingers and toes) Do not bring valuables to the hospital. [facility] is not responsible for valuables/personal belongings. Put on clean/comfortable clothes.  Please brush your teeth.  Ask your nurse before applying any prescription medications to the skin.

## 2024-03-08 ENCOUNTER — Encounter (HOSPITAL_COMMUNITY)
Admission: RE | Admit: 2024-03-08 | Discharge: 2024-03-08 | Disposition: A | Discharge status: Home / Self Care | Source: Ambulatory Visit | Attending: Neurological Surgery | Admitting: Neurological Surgery

## 2024-03-08 ENCOUNTER — Encounter (HOSPITAL_COMMUNITY): Payer: Self-pay

## 2024-03-08 ENCOUNTER — Other Ambulatory Visit: Payer: Self-pay

## 2024-03-08 VITALS — BP 108/81 | HR 87 | Temp 98.6°F | Resp 17 | Ht 62.0 in | Wt 126.0 lb

## 2024-03-08 DIAGNOSIS — Z01818 Encounter for other preprocedural examination: Secondary | ICD-10-CM

## 2024-03-08 DIAGNOSIS — Z01812 Encounter for preprocedural laboratory examination: Secondary | ICD-10-CM | POA: Insufficient documentation

## 2024-03-08 HISTORY — DX: Attention-deficit hyperactivity disorder, unspecified type: F90.9

## 2024-03-08 LAB — CBC
HCT: 40.8 % (ref 36.0–46.0)
Hemoglobin: 13.4 g/dL (ref 12.0–15.0)
MCH: 28.8 pg (ref 26.0–34.0)
MCHC: 32.8 g/dL (ref 30.0–36.0)
MCV: 87.7 fL (ref 80.0–100.0)
Platelets: 369 K/uL (ref 150–400)
RBC: 4.65 MIL/uL (ref 3.87–5.11)
RDW: 12.8 % (ref 11.5–15.5)
WBC: 7.1 K/uL (ref 4.0–10.5)
nRBC: 0 % (ref 0.0–0.2)

## 2024-03-08 LAB — TYPE AND SCREEN
ABO/RH(D): O POS
Antibody Screen: NEGATIVE

## 2024-03-08 LAB — SURGICAL PCR SCREEN
MRSA, PCR: NEGATIVE
Staphylococcus aureus: NEGATIVE

## 2024-03-08 NOTE — Progress Notes (Signed)
 PCP - Dr Almarie Scala Cardiologist - none  Chest x-ray - n/a EKG - n/a Stress Test - n/a ECHO - n/a Cardiac Cath - n/a  ICD Pacemaker/Loop - n/a  Sleep Study -  n/a  Diabetes - n/a  Aspirin & Blood Thinner Instructions:  n/a  NPO  Anesthesia review: no  STOP now taking any Aspirin (unless otherwise instructed by your surgeon), Aleve, Naproxen, Ibuprofen , Motrin , Advil , Goody's, BC's, all herbal medications, fish oil, and all vitamins.   Coronavirus Screening Do you have any of the following symptoms:  Cough yes/no: No Fever (>100.58F)  yes/no: No Runny nose yes/no: No Sore throat yes/no: No Difficulty breathing/shortness of breath  yes/no: No  Have you traveled in the last 14 days and where? yes/no: No  Patient verbalized understanding of instructions that were given to them at the PAT appointment. Patient was also instructed that they will need to review over the PAT instructions again at home before surgery.

## 2024-03-08 NOTE — Progress Notes (Signed)
 Surgical Instructions   Your procedure is scheduled on March 13, 2024. Report to [facility] Main Entrance A at 5:30 A.M., then check in with the Admitting office. Any questions or running late day of surgery: call [phone]  Questions prior to your surgery date: call [phone], Monday-Friday, 8am-4pm. If you experience any cold or flu symptoms such as cough, fever, chills, shortness of breath, etc. between now and your scheduled surgery, please notify us  at the above number.     Remember:  Do not eat or drink after midnight the night before your surgery-Sunday  Take these medicines the morning of surgery with A SIP OF WATER (Monday) : buPROPion (WELLBUTRIN XL)  DULoxetine (CYMBALTA)   May take these medicines IF NEEDED: albuterol  (PROVENTIL  HFA)  MCG/ACT inhaler  albuterol  (PROVENTIL ) nebulizer solution  Tylenol    One week prior to surgery, STOP taking any Aspirin (unless otherwise instructed by your surgeon) Aleve, Naproxen, Ibuprofen , Motrin , Advil , Goody's, BC's, all herbal medications, fish oil, and non-prescription vitamins.                     Do NOT Smoke (Tobacco/Vaping) for 24 hours prior to your procedure.  If you use a CPAP at night, you may bring your mask/headgear for your overnight stay.   You will be asked to remove any contacts, glasses, piercing's, hearing aid's, dentures/partials prior to surgery. Please bring cases for these items if needed.    Patients discharged the day of surgery will not be allowed to drive home, and someone needs to stay with them for 24 hours.  SURGICAL WAITING ROOM VISITATION Patients may have no more than 2 support people in the waiting area - these visitors may rotate.   Pre-op nurse will coordinate an appropriate time for 1 ADULT support person, who may not rotate, to accompany patient in pre-op.  Children under the age of 62 must have an adult with them who is not the patient and must remain in the main waiting area with  an adult.  If the patient needs to stay at the hospital during part of their recovery, the visitor guidelines for inpatient rooms apply.  Please refer to the [facility] website for the visitor guidelines for any additional information.   If you received a COVID test during your pre-op visit  it is requested that you wear a mask when out in public, stay away from anyone that may not be feeling well and notify your surgeon if you develop symptoms. If you have been in contact with anyone that has tested positive in the last 10 days please notify you surgeon.      Pre-operative CHG Bathing Instructions   You can play a key role in reducing the risk of infection after surgery. Your skin needs to be as free of germs as possible. You can reduce the number of germs on your skin by washing with CHG (chlorhexidine gluconate) soap before surgery. CHG is an antiseptic soap that kills germs and continues to kill germs even after washing.   DO NOT use if you have an allergy  to chlorhexidine/CHG or antibacterial soaps. If your skin becomes reddened or irritated, stop using the CHG and notify one of our RNs at [phone].              TAKE A SHOWER THE NIGHT BEFORE SURGERY   Please keep in mind the following:  DO NOT shave, including legs and underarms, 48 hours prior to surgery.   You may  shave your face before/day of surgery.  Place clean sheets on your bed the night before surgery Use a clean washcloth (not used since being washed) for shower. DO NOT sleep with pet's night before surgery.  CHG Shower Instructions:  Wash your face and private area with normal soap. If you choose to wash your hair, wash first with your normal shampoo.  After you use shampoo/soap, rinse your hair and body thoroughly to remove shampoo/soap residue.  Turn the water OFF and apply half the bottle of CHG soap to a CLEAN washcloth.  Apply CHG soap ONLY FROM YOUR NECK DOWN TO YOUR TOES (washing for 3-5 minutes)  DO NOT  use CHG soap on face, private areas, open wounds, or sores.  Pay special attention to the area where your surgery is being performed.  If you are having back surgery, having someone wash your back for you may be helpful. Wait 2 minutes after CHG soap is applied, then you may rinse off the CHG soap.  Pat dry with a clean towel  Put on clean pajamas    Additional instructions for the day of surgery: If you choose, you may shower the morning of surgery with an antibacterial soap.  DO NOT APPLY any lotions, deodorants, cologne, or perfumes.   Do not wear jewelry or makeup Do not wear nail polish, gel polish, artificial nails, or any other type of covering on natural nails (fingers and toes) Do not bring valuables to the hospital. [facility] is not responsible for valuables/personal belongings. Put on clean/comfortable clothes.  Please brush your teeth.  Ask your nurse before applying any prescription medications to the skin.

## 2024-03-08 NOTE — Progress Notes (Unsigned)
 Office Note     CC: Lumbar radiculopathy Requesting Provider:  Waylan Almarie SAUNDERS, MD  HPI: Taylor Rose is a 23 y.o. (07-12-2000) female presenting at the request of Dr. Victory Rose with lumbar radiculopathy and plans for L5-S1 ALIF.  She presents to my office for surgical discussion regarding anterior exposure of the L5-S1 disc space.    The pt is *** on a statin for cholesterol management.  The pt is *** on a daily aspirin.   Other AC:  *** The pt is *** on medication for hypertension.   The pt is *** diabetic.  Tobacco hx:  ***  Past Medical History:  Diagnosis Date   Acanthosis nigricans    ADHD (attention deficit hyperactivity disorder)    Allergic rhinitis    Allergy  to peanuts    and tree nuts   Anxiety    Asthma    mild   Attention deficit disorder predominant inattentive type    Depression    History of nicotine dependence    Irregular periods/menstrual cycles    Learning difficulty involving mathematics    Migraine    Obesity    Pneumonia 2010   x 1   Seasonal allergies     Past Surgical History:  Procedure Laterality Date   LUMBAR MICRODISCECTOMY     x 2 surgeries at [facility]   WISDOM TOOTH EXTRACTION      Social History   Socioeconomic History   Marital status: Single    Spouse name: Not on file   Number of children: Not on file   Years of education: Not on file   Highest education level: Not on file  Occupational History   Occupation: Student  Tobacco Use   Smoking status: Never   Smokeless tobacco: Never  Vaping Use   Vaping status: Former   Start date: 05/04/2017   Quit date: 05/04/2020   Substances: Nicotine, Flavoring  Substance and Sexual Activity   Alcohol use: Yes    Alcohol/week: 2.0 standard drinks of alcohol    Types: 2 Standard drinks or equivalent per week    Comment: occasional liquor   Drug use: No   Sexual activity: Yes    Birth control/protection: I.U.D.    Comment: Mirena IUD inserted 12/31/21   Other Topics Concern   Not on file  Social History Narrative   Taylor Rose is a 10th grade student at [employer].   Taylor Rose lives with her mother most of the time.   Taylor Rose enjoys tennis and managing the basketball team at school.   Taylor Rose does well in school but could improve.   Social Drivers of Corporate Investment Banker Strain: Not on file  Food Insecurity: Not on file  Transportation Needs: Not on file  Physical Activity: Not on file  Stress: Not on file  Social Connections: Not on file  Intimate Partner Violence: Not on file   *** Family History  Problem Relation Age of Onset   Diabetes Mother    Healthy Father     Current Outpatient Medications  Medication Sig Dispense Refill   albuterol  (PROVENTIL  HFA) 108 (90 Base) MCG/ACT inhaler Inhale 2 puffs into the lungs every 6 (six) hours as needed for wheezing or shortness of breath. 1 Inhaler 1   albuterol  (PROVENTIL ) (2.5 MG/3ML) 0.083% nebulizer solution Take 3 mLs (2.5 mg total) by nebulization every 4 (four) hours as needed for wheezing or shortness of breath. 75 mL 0   amphetamine-dextroamphetamine (ADDERALL XR)  20 MG 24 hr capsule Take 40 mg by mouth daily.     amphetamine-dextroamphetamine (ADDERALL) 10 MG tablet Take 10 mg by mouth every evening.     buPROPion (WELLBUTRIN XL) 300 MG 24 hr tablet Take 300 mg by mouth daily.     DULoxetine (CYMBALTA) 60 MG capsule Take 60 mg by mouth daily.     Ibuprofen  200 MG CAPS Take 1 capsule (200 mg total) by mouth 3 (three) times daily with meals as needed. (Patient not taking: Reported on 03/03/2024) 15 capsule 0   IPRATROPIUM BROMIDE  NA Place 2 sprays into both nostrils 4 (four) times daily.     levonorgestrel (MIRENA) 20 MCG/DAY IUD 1 each by Intrauterine route once.     predniSONE  (DELTASONE ) 20 MG tablet Take 2 tablets daily with breakfast. (Patient not taking: Reported on 03/03/2024) 10 tablet 0   Respiratory Therapy Supplies (NEBULIZER/TUBING/MOUTHPIECE)  KIT Use once every 4-6 hours as needed with nebulized medication. 1 kit 0   No current facility-administered medications for this visit.    Allergies  Allergen Reactions   Peanuts [Peanut  Oil] Nausea And Vomiting    Also allergic to tree nuts.   Amoxicillin Rash   Penicillins Rash     REVIEW OF SYSTEMS:  *** [X]  denotes positive finding, [ ]  denotes negative finding Cardiac  Comments:  Chest pain or chest pressure:    Shortness of breath upon exertion:    Short of breath when lying flat:    Irregular heart rhythm:        Vascular    Pain in calf, thigh, or hip brought on by ambulation:    Pain in feet at night that wakes you up from your sleep:     Blood clot in your veins:    Leg swelling:         Pulmonary    Oxygen at home:    Productive cough:     Wheezing:         Neurologic    Sudden weakness in arms or legs:     Sudden numbness in arms or legs:     Sudden onset of difficulty speaking or slurred speech:    Temporary loss of vision in one eye:     Problems with dizziness:         Gastrointestinal    Blood in stool:     Vomited blood:         Genitourinary    Burning when urinating:     Blood in urine:        Psychiatric    Major depression:         Hematologic    Bleeding problems:    Problems with blood clotting too easily:        Skin    Rashes or ulcers:        Constitutional    Fever or chills:      PHYSICAL EXAMINATION:  There were no vitals filed for this visit.  General:  WDWN in NAD; vital signs documented above Gait: Not observed HENT: WNL, normocephalic Pulmonary: normal non-labored breathing , without wheezing Cardiac: {Desc; regular/irreg:14544} HR Abdomen: soft, NT, no masses Skin: {With/Without:20273} rashes Vascular Exam/Pulses:  Right Left  Radial {Exam; arterial pulse strength 0-4:30167} {Exam; arterial pulse strength 0-4:30167}  Ulnar {Exam; arterial pulse strength 0-4:30167} {Exam; arterial pulse strength 0-4:30167}   Femoral {Exam; arterial pulse strength 0-4:30167} {Exam; arterial pulse strength 0-4:30167}  Popliteal {Exam; arterial pulse strength 0-4:30167} {Exam; arterial pulse  strength 0-4:30167}  DP {Exam; arterial pulse strength 0-4:30167} {Exam; arterial pulse strength 0-4:30167}  PT {Exam; arterial pulse strength 0-4:30167} {Exam; arterial pulse strength 0-4:30167}   Extremities: {With/Without:20273} ischemic changes, {With/Without:20273} Gangrene , {With/Without:20273} cellulitis; {With/Without:20273} open wounds;  Musculoskeletal: no muscle wasting or atrophy  Neurologic: A&O X 3;  No focal weakness or paresthesias are detected Psychiatric:  The pt has {Desc; normal/abnormal:11317::Normal} affect.   Non-Invasive Vascular Imaging:   ***    ASSESSMENT/PLAN: Taylor Rose is a 23 y.o. female presenting with ***   ***   Fonda FORBES Rim, MD [facility] [phone]

## 2024-03-09 ENCOUNTER — Ambulatory Visit: Payer: Self-pay | Attending: Vascular Surgery | Admitting: Vascular Surgery

## 2024-03-09 ENCOUNTER — Encounter: Payer: Self-pay | Admitting: Vascular Surgery

## 2024-03-09 VITALS — BP 101/69 | HR 81 | Temp 98.2°F | Resp 16 | Ht 62.0 in | Wt 129.9 lb

## 2024-03-09 DIAGNOSIS — M5416 Radiculopathy, lumbar region: Secondary | ICD-10-CM | POA: Diagnosis not present

## 2024-03-12 NOTE — Anesthesia Preprocedure Evaluation (Signed)
 Anesthesia Evaluation  Patient identified by MRN, date of birth, ID band Patient awake    Reviewed: Allergy  & Precautions, NPO status , Patient's Chart, lab work & pertinent test results  History of Anesthesia Complications Negative for: history of anesthetic complications  Airway Mallampati: I  TM Distance: >3 FB Neck ROM: Full    Dental  (+) Dental Advisory Given, Teeth Intact   Pulmonary asthma , COPD,  COPD inhaler   breath sounds clear to auscultation       Cardiovascular negative cardio ROS  Rhythm:Regular Rate:Normal     Neuro/Psych  Headaches PSYCHIATRIC DISORDERS (ADHD) Anxiety Depression       GI/Hepatic negative GI ROS, Neg liver ROS,,,  Endo/Other  negative endocrine ROS    Renal/GU negative Renal ROS     Musculoskeletal   Abdominal   Peds  Hematology Hb 13.4, plt 369k   Anesthesia Other Findings   Reproductive/Obstetrics Mirena                              Anesthesia Physical Anesthesia Plan  ASA: 2  Anesthesia Plan: General   Post-op Pain Management: Tylenol  PO (pre-op)*   Induction: Intravenous  PONV Risk Score and Plan: Ondansetron , Dexamethasone and Scopolamine patch - Pre-op  Airway Management Planned: Oral ETT  Additional Equipment: None  Intra-op Plan:   Post-operative Plan: Extubation in OR  Informed Consent:   Plan Discussed with:   Anesthesia Plan Comments:          Anesthesia Quick Evaluation

## 2024-03-13 ENCOUNTER — Inpatient Hospital Stay (HOSPITAL_COMMUNITY): Admission: RE | Disposition: A | Payer: Self-pay | Source: Home / Self Care | Attending: Neurological Surgery

## 2024-03-13 ENCOUNTER — Inpatient Hospital Stay (HOSPITAL_COMMUNITY): Payer: Self-pay | Admitting: Anesthesiology

## 2024-03-13 ENCOUNTER — Inpatient Hospital Stay (HOSPITAL_COMMUNITY)
Admission: RE | Admit: 2024-03-13 | Discharge: 2024-03-14 | DRG: 451 | Disposition: A | Discharge status: Home / Self Care | Payer: Self-pay | Attending: Neurological Surgery | Admitting: Neurological Surgery

## 2024-03-13 ENCOUNTER — Inpatient Hospital Stay (HOSPITAL_COMMUNITY)

## 2024-03-13 DIAGNOSIS — Z91010 Allergy to peanuts: Secondary | ICD-10-CM

## 2024-03-13 DIAGNOSIS — M5126 Other intervertebral disc displacement, lumbar region: Secondary | ICD-10-CM | POA: Diagnosis present

## 2024-03-13 DIAGNOSIS — Z8701 Personal history of pneumonia (recurrent): Secondary | ICD-10-CM | POA: Diagnosis not present

## 2024-03-13 DIAGNOSIS — Z4889 Encounter for other specified surgical aftercare: Secondary | ICD-10-CM

## 2024-03-13 DIAGNOSIS — M5416 Radiculopathy, lumbar region: Secondary | ICD-10-CM | POA: Diagnosis not present

## 2024-03-13 DIAGNOSIS — Z6822 Body mass index (BMI) 22.0-22.9, adult: Secondary | ICD-10-CM

## 2024-03-13 DIAGNOSIS — Z880 Allergy status to penicillin: Secondary | ICD-10-CM

## 2024-03-13 DIAGNOSIS — J45909 Unspecified asthma, uncomplicated: Secondary | ICD-10-CM | POA: Diagnosis present

## 2024-03-13 DIAGNOSIS — Z833 Family history of diabetes mellitus: Secondary | ICD-10-CM

## 2024-03-13 DIAGNOSIS — M5116 Intervertebral disc disorders with radiculopathy, lumbar region: Principal | ICD-10-CM | POA: Diagnosis present

## 2024-03-13 DIAGNOSIS — Z87891 Personal history of nicotine dependence: Secondary | ICD-10-CM

## 2024-03-13 DIAGNOSIS — M5117 Intervertebral disc disorders with radiculopathy, lumbosacral region: Secondary | ICD-10-CM

## 2024-03-13 DIAGNOSIS — E669 Obesity, unspecified: Secondary | ICD-10-CM | POA: Diagnosis present

## 2024-03-13 HISTORY — PX: ABDOMINAL EXPOSURE: SHX5708

## 2024-03-13 HISTORY — PX: ANTERIOR LUMBAR FUSION: SHX1170

## 2024-03-13 LAB — POCT PREGNANCY, URINE: Preg Test, Ur: NEGATIVE

## 2024-03-13 LAB — ABO/RH: ABO/RH(D): O POS

## 2024-03-13 SURGERY — ANTERIOR LUMBAR FUSION 1 LEVEL
Anesthesia: General

## 2024-03-13 MED ORDER — ROCURONIUM BROMIDE 10 MG/ML (PF) SYRINGE
PREFILLED_SYRINGE | INTRAVENOUS | Status: AC
Start: 2024-03-13 — End: 2024-03-13
  Filled 2024-03-13: qty 10

## 2024-03-13 MED ORDER — FENTANYL CITRATE (PF) 100 MCG/2ML IJ SOLN
INTRAMUSCULAR | Status: AC
  Filled 2024-03-13: qty 2

## 2024-03-13 MED ORDER — CEFAZOLIN SODIUM-DEXTROSE 2-4 GM/100ML-% IV SOLN
2.0000 g | INTRAVENOUS | Status: AC
  Administered 2024-03-13 (×2): 2 g via INTRAVENOUS
  Filled 2024-03-13: qty 100

## 2024-03-13 MED ORDER — ROCURONIUM BROMIDE 10 MG/ML (PF) SYRINGE
PREFILLED_SYRINGE | INTRAVENOUS | Status: DC | PRN
  Administered 2024-03-13 (×5): 20 mg via INTRAVENOUS
  Administered 2024-03-13: 10 mg via INTRAVENOUS
  Administered 2024-03-13: 60 mg via INTRAVENOUS

## 2024-03-13 MED ORDER — KETAMINE HCL 50 MG/5ML IJ SOSY
PREFILLED_SYRINGE | INTRAMUSCULAR | Status: AC
  Filled 2024-03-13: qty 5

## 2024-03-13 MED ORDER — ACETAMINOPHEN 325 MG PO TABS
650.0000 mg | ORAL_TABLET | ORAL | Status: DC | PRN
Start: 2024-03-13 — End: 2024-03-14

## 2024-03-13 MED ORDER — 0.9 % SODIUM CHLORIDE (POUR BTL) OPTIME
TOPICAL | Status: DC | PRN
  Administered 2024-03-13 (×2): 1000 mL

## 2024-03-13 MED ORDER — METHOCARBAMOL 500 MG PO TABS: 500.0000 mg | ORAL_TABLET | Freq: Four times a day (QID) | ORAL | Status: DC | PRN

## 2024-03-13 MED ORDER — OXYCODONE HCL 5 MG PO TABS
ORAL_TABLET | ORAL | Status: AC
  Filled 2024-03-13: qty 1

## 2024-03-13 MED ORDER — MENTHOL 3 MG MT LOZG: 1.0000 | LOZENGE | OROMUCOSAL | Status: DC | PRN

## 2024-03-13 MED ORDER — THROMBIN 20000 UNITS EX SOLR
CUTANEOUS | Status: DC | PRN
  Administered 2024-03-13: 20 mL via TOPICAL

## 2024-03-13 MED ORDER — SODIUM CHLORIDE 0.9% FLUSH: 3.0000 mL | INTRAVENOUS | Status: DC | PRN

## 2024-03-13 MED ORDER — PROPOFOL 500 MG/50ML IV EMUL
INTRAVENOUS | Status: DC | PRN
Start: 2024-03-13 — End: 2024-03-13
  Administered 2024-03-13: 50 ug/kg/min via INTRAVENOUS

## 2024-03-13 MED ORDER — HYDROMORPHONE HCL 1 MG/ML IJ SOLN
INTRAMUSCULAR | Status: AC
  Filled 2024-03-13: qty 1

## 2024-03-13 MED ORDER — PROPOFOL 10 MG/ML IV BOLUS
INTRAVENOUS | Status: DC | PRN
  Administered 2024-03-13: 200 mg via INTRAVENOUS

## 2024-03-13 MED ORDER — IPRATROPIUM BROMIDE 0.03 % NA SOLN: 2.0000 | Freq: Four times a day (QID) | NASAL | Status: DC

## 2024-03-13 MED ORDER — SODIUM CHLORIDE 0.9% FLUSH: 3.0000 mL | Freq: Two times a day (BID) | INTRAVENOUS | Status: DC

## 2024-03-13 MED ORDER — LIDOCAINE 2% (20 MG/ML) 5 ML SYRINGE
INTRAMUSCULAR | Status: AC
  Filled 2024-03-13: qty 5

## 2024-03-13 MED ORDER — MIDAZOLAM HCL (PF) 2 MG/2ML IJ SOLN
INTRAMUSCULAR | Status: DC | PRN
  Administered 2024-03-13: 2 mg via INTRAVENOUS

## 2024-03-13 MED ORDER — MIDAZOLAM HCL 2 MG/2ML IJ SOLN
INTRAMUSCULAR | Status: AC
Start: 2024-03-13 — End: 2024-03-13
  Filled 2024-03-13: qty 2

## 2024-03-13 MED ORDER — DEXAMETHASONE SOD PHOSPHATE PF 10 MG/ML IJ SOLN
INTRAMUSCULAR | Status: DC | PRN
  Administered 2024-03-13: 10 mg via INTRAVENOUS

## 2024-03-13 MED ORDER — ALUM & MAG HYDROXIDE-SIMETH 200-200-20 MG/5ML PO SUSP: 30.0000 mL | Freq: Four times a day (QID) | ORAL | Status: DC | PRN

## 2024-03-13 MED ORDER — PHENOL 1.4 % MT LIQD: 1.0000 | OROMUCOSAL | Status: DC | PRN

## 2024-03-13 MED ORDER — THROMBIN 20000 UNITS EX SOLR
CUTANEOUS | Status: AC
Start: 2024-03-13 — End: 2024-03-13
  Filled 2024-03-13: qty 20000

## 2024-03-13 MED ORDER — MIDAZOLAM HCL (PF) 2 MG/2ML IJ SOLN: 0.5000 mg | Freq: Once | INTRAMUSCULAR | Status: DC | PRN

## 2024-03-13 MED ORDER — SUGAMMADEX SODIUM 200 MG/2ML IV SOLN
INTRAVENOUS | Status: DC | PRN
  Administered 2024-03-13: 200 mg via INTRAVENOUS

## 2024-03-13 MED ORDER — PROPOFOL 10 MG/ML IV BOLUS
INTRAVENOUS | Status: AC
  Filled 2024-03-13: qty 20

## 2024-03-13 MED ORDER — THROMBIN 5000 UNITS EX KIT
PACK | CUTANEOUS | Status: AC
  Filled 2024-03-13: qty 1

## 2024-03-13 MED ORDER — LACTATED RINGERS IV SOLN: INTRAVENOUS | Status: DC | PRN

## 2024-03-13 MED ORDER — FENTANYL CITRATE (PF) 250 MCG/5ML IJ SOLN
INTRAMUSCULAR | Status: DC | PRN
Start: 2024-03-13 — End: 2024-03-13
  Administered 2024-03-13: 100 ug via INTRAVENOUS

## 2024-03-13 MED ORDER — ONDANSETRON HCL 4 MG PO TABS: 4.0000 mg | ORAL_TABLET | Freq: Four times a day (QID) | ORAL | Status: DC | PRN

## 2024-03-13 MED ORDER — ONDANSETRON HCL 4 MG/2ML IJ SOLN
INTRAMUSCULAR | Status: AC
Start: 2024-03-13 — End: 2024-03-13
  Filled 2024-03-13: qty 2

## 2024-03-13 MED ORDER — BUPIVACAINE HCL (PF) 0.5 % IJ SOLN
INTRAMUSCULAR | Status: DC | PRN
  Administered 2024-03-13: 5 mL

## 2024-03-13 MED ORDER — CEFAZOLIN SODIUM 1 G IJ SOLR
INTRAMUSCULAR | Status: AC
  Filled 2024-03-13: qty 20

## 2024-03-13 MED ORDER — THROMBIN 5000 UNITS EX SOLR
OROMUCOSAL | Status: DC | PRN
  Administered 2024-03-13 (×2): 5 mL via TOPICAL

## 2024-03-13 MED ORDER — ORAL CARE MOUTH RINSE: 15.0000 mL | Freq: Once | OROMUCOSAL | Status: AC

## 2024-03-13 MED ORDER — ACETAMINOPHEN 650 MG RE SUPP: 650.0000 mg | RECTAL | Status: DC | PRN

## 2024-03-13 MED ORDER — BUPIVACAINE HCL (PF) 0.5 % IJ SOLN
INTRAMUSCULAR | Status: AC
  Filled 2024-03-13: qty 30

## 2024-03-13 MED ORDER — ADHERUS DURAL SEALANT
PACK | TOPICAL | Status: DC | PRN
  Administered 2024-03-13: 1 via TOPICAL

## 2024-03-13 MED ORDER — LIDOCAINE-EPINEPHRINE 1 %-1:100000 IJ SOLN
INTRAMUSCULAR | Status: DC | PRN
  Administered 2024-03-13: 5 mL

## 2024-03-13 MED ORDER — HYDROMORPHONE HCL 1 MG/ML IJ SOLN
INTRAMUSCULAR | Status: DC | PRN
  Administered 2024-03-13: .6 mg via INTRAVENOUS
  Administered 2024-03-13 (×2): .2 mg via INTRAVENOUS

## 2024-03-13 MED ORDER — POLYETHYLENE GLYCOL 3350 17 G PO PACK: 17.0000 g | PACK | Freq: Every day | ORAL | Status: DC | PRN

## 2024-03-13 MED ORDER — DULOXETINE HCL 60 MG PO CPEP: 60.0000 mg | ORAL_CAPSULE | Freq: Every day | ORAL | Status: DC

## 2024-03-13 MED ORDER — HYDROMORPHONE HCL 1 MG/ML IJ SOLN: 0.5000 mg | INTRAMUSCULAR | Status: DC | PRN

## 2024-03-13 MED ORDER — PHENYLEPHRINE HCL-NACL 20-0.9 MG/250ML-% IV SOLN
INTRAVENOUS | Status: DC | PRN
  Administered 2024-03-13: 40 ug/min via INTRAVENOUS

## 2024-03-13 MED ORDER — PROPOFOL 1000 MG/100ML IV EMUL
INTRAVENOUS | Status: AC
Start: 2024-03-13 — End: 2024-03-13
  Filled 2024-03-13: qty 100

## 2024-03-13 MED ORDER — METHOCARBAMOL 1000 MG/10ML IJ SOLN: 500.0000 mg | Freq: Four times a day (QID) | INTRAMUSCULAR | Status: DC | PRN

## 2024-03-13 MED ORDER — LIDOCAINE 2% (20 MG/ML) 5 ML SYRINGE
INTRAMUSCULAR | Status: DC | PRN
  Administered 2024-03-13: 20 mg via INTRAVENOUS

## 2024-03-13 MED ORDER — BISACODYL 10 MG RE SUPP: 10.0000 mg | Freq: Every day | RECTAL | Status: DC | PRN

## 2024-03-13 MED ORDER — SCOPOLAMINE 1 MG/3DAYS TD PT72
1.0000 | MEDICATED_PATCH | TRANSDERMAL | Status: DC
  Administered 2024-03-13: 1 mg via TRANSDERMAL
  Filled 2024-03-13: qty 1

## 2024-03-13 MED ORDER — SENNA 8.6 MG PO TABS
1.0000 | ORAL_TABLET | Freq: Two times a day (BID) | ORAL | Status: DC
  Administered 2024-03-13: 8.6 mg via ORAL
  Filled 2024-03-13: qty 1

## 2024-03-13 MED ORDER — HYDROMORPHONE HCL 1 MG/ML IJ SOLN
0.2500 mg | INTRAMUSCULAR | Status: DC | PRN
  Administered 2024-03-13: 0.5 mg via INTRAVENOUS

## 2024-03-13 MED ORDER — LACTATED RINGERS IV SOLN: INTRAVENOUS | Status: DC

## 2024-03-13 MED ORDER — SODIUM CHLORIDE 0.9 % IV SOLN: 250.0000 mL | INTRAVENOUS | Status: DC

## 2024-03-13 MED ORDER — SODIUM CHLORIDE (PF) 0.9 % IJ SOLN
INTRAMUSCULAR | Status: AC
  Filled 2024-03-13: qty 10

## 2024-03-13 MED ORDER — MEPERIDINE HCL 25 MG/ML IJ SOLN: 6.2500 mg | INTRAMUSCULAR | Status: DC | PRN

## 2024-03-13 MED ORDER — CHLORHEXIDINE GLUCONATE 0.12 % MT SOLN
OROMUCOSAL | Status: AC
  Administered 2024-03-13: 15 mL via OROMUCOSAL
  Filled 2024-03-13: qty 15

## 2024-03-13 MED ORDER — OXYCODONE-ACETAMINOPHEN 5-325 MG PO TABS
1.0000 | ORAL_TABLET | ORAL | Status: DC | PRN
  Administered 2024-03-13 – 2024-03-14 (×4): 1 via ORAL
  Filled 2024-03-13 (×4): qty 1

## 2024-03-13 MED ORDER — CHLORHEXIDINE GLUCONATE CLOTH 2 % EX PADS: 6.0000 | MEDICATED_PAD | Freq: Once | CUTANEOUS | Status: DC

## 2024-03-13 MED ORDER — AMPHETAMINE-DEXTROAMPHETAMINE 10 MG PO TABS: 10.0000 mg | ORAL_TABLET | Freq: Every evening | ORAL | Status: DC

## 2024-03-13 MED ORDER — FLEET ENEMA RE ENEM: 1.0000 | ENEMA | Freq: Once | RECTAL | Status: DC | PRN

## 2024-03-13 MED ORDER — LIDOCAINE-EPINEPHRINE 1 %-1:100000 IJ SOLN
INTRAMUSCULAR | Status: AC
Start: 2024-03-13 — End: 2024-03-13
  Filled 2024-03-13: qty 1

## 2024-03-13 MED ORDER — AMPHETAMINE-DEXTROAMPHET ER 20 MG PO CP24: 40.0000 mg | ORAL_CAPSULE | Freq: Every day | ORAL | Status: DC

## 2024-03-13 MED ORDER — OXYCODONE HCL 5 MG/5ML PO SOLN: 5.0000 mg | Freq: Once | ORAL | Status: AC | PRN

## 2024-03-13 MED ORDER — DOCUSATE SODIUM 100 MG PO CAPS
100.0000 mg | ORAL_CAPSULE | Freq: Two times a day (BID) | ORAL | Status: DC
  Administered 2024-03-13: 100 mg via ORAL
  Filled 2024-03-13: qty 1

## 2024-03-13 MED ORDER — ONDANSETRON HCL 4 MG/2ML IJ SOLN: 4.0000 mg | Freq: Four times a day (QID) | INTRAMUSCULAR | Status: DC | PRN

## 2024-03-13 MED ORDER — SODIUM CHLORIDE 0.9 % IV SOLN
0.1500 ug/kg/min | INTRAVENOUS | Status: AC
Start: 2024-03-13 — End: 2024-03-13
  Administered 2024-03-13: .05 ug/kg/min via INTRAVENOUS
  Filled 2024-03-13: qty 2000

## 2024-03-13 MED ORDER — ACETAMINOPHEN 500 MG PO TABS
1000.0000 mg | ORAL_TABLET | Freq: Once | ORAL | Status: AC
  Administered 2024-03-13: 1000 mg via ORAL
  Filled 2024-03-13: qty 2

## 2024-03-13 MED ORDER — ONDANSETRON HCL 4 MG/2ML IJ SOLN
INTRAMUSCULAR | Status: DC | PRN
  Administered 2024-03-13: 4 mg via INTRAVENOUS

## 2024-03-13 MED ORDER — PHENYLEPHRINE HCL-NACL 20-0.9 MG/250ML-% IV SOLN
INTRAVENOUS | Status: AC
Start: 2024-03-13 — End: 2024-03-13
  Filled 2024-03-13: qty 250

## 2024-03-13 MED ORDER — SUCCINYLCHOLINE CHLORIDE 200 MG/10ML IV SOSY
PREFILLED_SYRINGE | INTRAVENOUS | Status: AC
  Filled 2024-03-13: qty 10

## 2024-03-13 MED ORDER — OXYCODONE-ACETAMINOPHEN 5-325 MG PO TABS
1.0000 | ORAL_TABLET | Freq: Four times a day (QID) | ORAL | Status: DC | PRN
  Administered 2024-03-13: 1 via ORAL
  Filled 2024-03-13: qty 1

## 2024-03-13 MED ORDER — BUPROPION HCL ER (XL) 300 MG PO TB24
300.0000 mg | ORAL_TABLET | Freq: Every day | ORAL | Status: DC
Start: 2024-03-13 — End: 2024-03-13

## 2024-03-13 MED ORDER — ALBUTEROL SULFATE (2.5 MG/3ML) 0.083% IN NEBU
3.0000 mL | INHALATION_SOLUTION | Freq: Four times a day (QID) | RESPIRATORY_TRACT | Status: DC | PRN
Start: 2024-03-13 — End: 2024-03-14

## 2024-03-13 MED ORDER — PHENYLEPHRINE HCL (PRESSORS) 10 MG/ML IV SOLN
INTRAVENOUS | Status: DC | PRN
  Administered 2024-03-13: 100 ug via INTRAVENOUS
  Administered 2024-03-13: 50 ug via INTRAVENOUS
  Administered 2024-03-13 (×2): 100 ug via INTRAVENOUS

## 2024-03-13 MED ORDER — OXYCODONE HCL 5 MG PO TABS
5.0000 mg | ORAL_TABLET | Freq: Once | ORAL | Status: AC | PRN
  Administered 2024-03-13: 5 mg via ORAL

## 2024-03-13 MED ORDER — SODIUM CHLORIDE 0.9 % IV SOLN
0.1500 ug/kg/min | INTRAVENOUS | Status: DC
  Filled 2024-03-13: qty 2000

## 2024-03-13 MED ORDER — CEFAZOLIN SODIUM-DEXTROSE 2-4 GM/100ML-% IV SOLN
2.0000 g | Freq: Three times a day (TID) | INTRAVENOUS | Status: AC
  Administered 2024-03-13 – 2024-03-14 (×2): 2 g via INTRAVENOUS
  Filled 2024-03-13 (×2): qty 100

## 2024-03-13 MED ORDER — CHLORHEXIDINE GLUCONATE 0.12 % MT SOLN: 15.0000 mL | Freq: Once | OROMUCOSAL | Status: AC

## 2024-03-13 SURGICAL SUPPLY — 80 items
BAG COUNTER SPONGE SURGICOUNT (BAG) ×2 IMPLANT
BAND RUBBER #18 3X1/16 STRL (MISCELLANEOUS) IMPLANT
BASKET BONE COLLECTION (BASKET) IMPLANT
BOLT ALIF MODULUS 5X17.5 (Bolt) IMPLANT
BOLT ALIF MODULUS 5X20 (Bolt) IMPLANT
BOLT ALIF MODULUS 5X22.5 (Bolt) IMPLANT
BONE MATRIX OSTEOCEL PRO LRG (Bone Implant) IMPLANT
BUR BARREL STRAIGHT FLUTE 4.0 (BURR) ×1 IMPLANT
BUR MATCHSTICK NEURO 3.0 LAGG (BURR) IMPLANT
CANISTER SUCTION 3000ML PPV (SUCTIONS) ×1 IMPLANT
CLIP APPLIE 11 MED OPEN (CLIP) IMPLANT
CLIP LIGATING EXTRA MED SLVR (CLIP) IMPLANT
CLIP TI MEDIUM 24 (CLIP) ×1 IMPLANT
CLIP TI MEDIUM 6 (CLIP) ×1 IMPLANT
CLIP TI WIDE RED SMALL 24 (CLIP) ×1 IMPLANT
CLIP TI WIDE RED SMALL 6 (CLIP) ×1 IMPLANT
COVER BACK TABLE 60X90IN (DRAPES) ×1 IMPLANT
DERMABOND ADVANCED .7 DNX12 (GAUZE/BANDAGES/DRESSINGS) ×1 IMPLANT
DRAPE C-ARM 42X72 X-RAY (DRAPES) ×1 IMPLANT
DRAPE C-ARMOR (DRAPES) ×1 IMPLANT
DRAPE LAPAROTOMY 100X72X124 (DRAPES) ×1 IMPLANT
DRAPE MICROSCOPE LEICA (MISCELLANEOUS) IMPLANT
DRSG OPSITE POSTOP 4X6 (GAUZE/BANDAGES/DRESSINGS) IMPLANT
DRSG OPSITE POSTOP 4X8 (GAUZE/BANDAGES/DRESSINGS) IMPLANT
DURAPREP 26ML APPLICATOR (WOUND CARE) ×1 IMPLANT
ELECTRODE BLDE 4.0 EZ CLN MEGD (MISCELLANEOUS) ×2 IMPLANT
ELECTRODE REM PT RTRN 9FT ADLT (ELECTROSURGICAL) ×1 IMPLANT
GAUZE 4X4 16PLY ~~LOC~~+RFID DBL (SPONGE) IMPLANT
GLOVE BIO SURGEON STRL SZ8 (GLOVE) IMPLANT
GLOVE BIOGEL PI IND STRL 8 (GLOVE) ×1 IMPLANT
GLOVE BIOGEL PI IND STRL 8.5 (GLOVE) ×1 IMPLANT
GLOVE ECLIPSE 8.5 STRL (GLOVE) ×1 IMPLANT
GLOVE INDICATOR 8.5 STRL (GLOVE) IMPLANT
GLOVE SS BIOGEL STRL SZ 7.5 (GLOVE) ×1 IMPLANT
GLOVE SURG SS PI 6.5 STRL IVOR (GLOVE) IMPLANT
GOWN STRL REUS W/ TWL LRG LVL3 (GOWN DISPOSABLE) ×1 IMPLANT
GOWN STRL REUS W/ TWL XL LVL3 (GOWN DISPOSABLE) ×1 IMPLANT
GOWN STRL REUS W/TWL 2XL LVL3 (GOWN DISPOSABLE) ×3 IMPLANT
GRAFT DURAGEN MATRIX 1WX1L (Tissue) IMPLANT
HEMOSTAT POWDER KIT SURGIFOAM (HEMOSTASIS) ×1 IMPLANT
INSERT FOGARTY 61MM (MISCELLANEOUS) IMPLANT
INSERT FOGARTY SM (MISCELLANEOUS) IMPLANT
KIT BASIN OR (CUSTOM PROCEDURE TRAY) ×1 IMPLANT
KIT TURNOVER KIT B (KITS) ×1 IMPLANT
NDL HYPO 25X1 1.5 SAFETY (NEEDLE) IMPLANT
NDL SPNL 18GX3.5 QUINCKE PK (NEEDLE) IMPLANT
NEEDLE HYPO 25X1 1.5 SAFETY (NEEDLE) IMPLANT
NEEDLE SPNL 18GX3.5 QUINCKE PK (NEEDLE) IMPLANT
PACK LAMINECTOMY NEURO (CUSTOM PROCEDURE TRAY) ×1 IMPLANT
PAD ARMBOARD POSITIONER FOAM (MISCELLANEOUS) ×2 IMPLANT
PATTIES SURGICAL .5 X1 (DISPOSABLE) ×1 IMPLANT
PATTIES SURGICAL 1X1 (DISPOSABLE) IMPLANT
SEALANT ADHERUS EXTEND TIP (MISCELLANEOUS) IMPLANT
SOLN 0.9% NACL POUR BTL 1000ML (IV SOLUTION) ×1 IMPLANT
SOLN STERILE WATER BTL 1000 ML (IV SOLUTION) ×1 IMPLANT
SPACER ALIF MOD 6X38X28 10D (Spacer) IMPLANT
SPIKE FLUID TRANSFER (MISCELLANEOUS) ×1 IMPLANT
SPONGE INTESTINAL PEANUT (DISPOSABLE) ×1 IMPLANT
SPONGE SURGIFOAM ABS GEL 100 (HEMOSTASIS) ×1 IMPLANT
SPONGE T-LAP 18X18 ~~LOC~~+RFID (SPONGE) ×1 IMPLANT
SPONGE T-LAP 4X18 ~~LOC~~+RFID (SPONGE) ×2 IMPLANT
SUT PDS AB 1 CTX 36 (SUTURE) ×2 IMPLANT
SUT PROLENE 4-0 RB1 .5 CRCL 36 (SUTURE) IMPLANT
SUT PROLENE 5 0 CC1 (SUTURE) IMPLANT
SUT PROLENE 6 0 C 1 30 (SUTURE) ×1 IMPLANT
SUT PROLENE 6 0 CC (SUTURE) IMPLANT
SUT SILK 0 TIES 10X30 (SUTURE) ×1 IMPLANT
SUT SILK 2 0 TIES 10X30 (SUTURE) ×1 IMPLANT
SUT SILK 2 0SH CR/8 30 (SUTURE) IMPLANT
SUT SILK 3 0SH CR/8 30 (SUTURE) IMPLANT
SUT VIC AB 0 CT1 27XBRD ANBCTR (SUTURE) IMPLANT
SUT VIC AB 1 CT1 18XBRD ANBCTR (SUTURE) ×1 IMPLANT
SUT VIC AB 2-0 CP2 18 (SUTURE) ×1 IMPLANT
SUT VIC AB 3-0 SH 8-18 (SUTURE) ×1 IMPLANT
SUT VIC AB 4-0 RB1 18 (SUTURE) ×1 IMPLANT
TIP KERRISON THIN FOOTPLATE 1M (MISCELLANEOUS) IMPLANT
TOWEL GREEN STERILE (TOWEL DISPOSABLE) ×1 IMPLANT
TOWEL GREEN STERILE FF (TOWEL DISPOSABLE) IMPLANT
TRAY FOLEY MTR SLVR 14FR STAT (SET/KITS/TRAYS/PACK) IMPLANT
TRAY FOLEY MTR SLVR 16FR STAT (SET/KITS/TRAYS/PACK) ×1 IMPLANT

## 2024-03-13 NOTE — Op Note (Signed)
 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  Arthrodesis with autograft. Surgeon: Victory Gens First Assistant: Arley helling MD Approach: Dr. Gretta. Anesthesia: General endotracheal Indications: The patient is a 60-year-old individuals had significant lumbar radicular pains with back pain secondary to recurrent herniated nucleus pulposus at the level of L4-L5.  She was advised regarding the need for surgical decompression sometime ago however notable delays from insurance approval leg case to the current time.  Procedure the patient was brought to the operating room placed on table supine position after placing a Foley catheter Dr. Gretta and Cheryle Simpers performed an anterior retroperitoneal approach to the level of L4-L5.  The retractors were placed when I arrived and the L4-5 space was identified positively with a radiograph.  I opened the anterior longitudinal ligament with a #15 blade and a combination of curettes and rongeur's were used to evacuate substantial quantities of severely degenerated desiccated disc material.  As the discectomy was completed the endplates were rongeured smooth.  Then we proceeded to open the 3 longitudinal ligament which opened easily off to the right side.  Then on the central area there was significant adhesion to the underlying dura and just lateral to this a bony mass was encountered.  This was explored for all the edges and extended far laterally.  Then we proceeded to drill the bony area of the mass and it extended very deeply.  It was done in a very piecemeal fashion Chane under magnification.  Ultimately 2 pieces of bone were removed but there was still significant bony material that was the disc herniation itself  that had evidently calcified solidly.  I drilled until we were able to reach the fascia on the opposite side of the dura however on the medial aspect was a significant bony density adherent to the dura.  I dissected carefully however dural incursion occurred there is evidence of a nerve root at this opening.  CSF was decompressed from this region.  We then proceeded to use the operating microscope to do further exploration and was ultimately noted by both myself and Dr. Helling that further dissection through this body approach was not going to be likely unless a good portion of the vertebral body was sacrificed.  At this point we checked for hemostasis carefully were all the surrounding edges and felt that we obtained his good decompression as could be obtained from this approach.  With this dural rent was patched with a small piece of DuraGen and Adheris biologic cement.  The interspace was then sized and it was felt that a 6 x 38 x 28 mm spacer with 10 degrees lordosis would fit best into this interval.  This was filled with Osteocel allograft.  He was then placed into the interspace carefully checking with radiographic confirmation.  Then two 5.0 x 20 2.5 screws were placed medially 1 and L4 and 1 and L5 and on the left side a 17.5 mm screw was placed on the left side and a 20 mm screw was placed on the right side.  Was in the lateral aspect.  Hemostasis was then carefully checked and when verified the retractors were carefully removed the area was inspected carefully for any hemostasis.  And then the anterior rectus sheath was closed with #1 PDS in a running fashion  2-0 Vicryl was used in the subcutaneous tissues 3-0 and 4-0 Vicryl subcuticularly Dermabond was placed on the skin.  Blood loss was estimated at 100 cc.  Dr. Onetha help with exploration of the decompression particularly the portions of the epidural mass that were drilled away.

## 2024-03-13 NOTE — H&P (Signed)
 Office Note   Patient seen and examined in preop holding.  No complaints. No changes to medication history or physical exam since last seen in clinic. After discussing the risks and benefits of L4-L5 ALIF for lumbar radiculopathy, Taylor Rose elected to proceed.   Taylor FORBES Rim MD   CC: Lumbar radiculopathy Requesting Provider:  No ref. provider found  HPI: Taylor Rose is a 23 y.o. (13-Mar-2001) female presenting at the request of Dr. Victory Gens with lumbar radiculopathy and plans for L4-L5 ALIF.  She presents to my office for surgical discussion regarding anterior exposure of the L4-L5 disc space.  On exam Taylor Rose was doing well.  We called her mother during the consultation.  Taylor Rose has a several year history of bilateral lower extremity radiculopathy which is worse in the left leg.  She has undergone microdiscectomy x 2 with improvement, however continues to have left lower extremity radiculopathy.   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  No history of abdominal surgery    Past Medical History:  Diagnosis Date   Acanthosis nigricans    ADHD (attention deficit hyperactivity disorder)    Allergic rhinitis    Allergy  to peanuts    and tree nuts   Anxiety    Asthma    mild   Attention deficit disorder predominant inattentive type    Depression    History of nicotine dependence    Irregular periods/menstrual cycles    Learning difficulty involving mathematics    Migraine    Obesity    Pneumonia 2010   x 1   Seasonal allergies     Past Surgical History:  Procedure Laterality Date   LUMBAR MICRODISCECTOMY     x 2 surgeries at [facility]   WISDOM TOOTH EXTRACTION      Social History   Socioeconomic History   Marital status: Single    Spouse name: Not on file   Number of children: Not on file   Years of education: Not on file    Highest education level: Not on file  Occupational History   Occupation: Student  Tobacco Use   Smoking status: Never   Smokeless tobacco: Never  Vaping Use   Vaping status: Former   Start date: 05/04/2017   Quit date: 05/04/2020   Substances: Nicotine, Flavoring  Substance and Sexual Activity   Alcohol use: Yes    Alcohol/week: 2.0 standard drinks of alcohol    Types: 2 Standard drinks or equivalent per week    Comment: occasional liquor   Drug use: No   Sexual activity: Yes    Birth control/protection: I.U.D.    Comment: Mirena IUD inserted 12/31/21  Other Topics Concern   Not on file  Social History Narrative   Taylor Rose is a 10th grade student at [employer].   Taylor Rose lives with her mother most of the time.   Taylor Rose enjoys tennis and managing the basketball team at school.   Taylor Rose does well in school but could improve.   Social Drivers of Corporate Investment Banker Strain: Not on file  Food Insecurity: Not on file  Transportation Needs: Not on file  Physical Activity: Not on file  Stress: Not on file  Social Connections: Not on file  Intimate Partner Violence: Not on file   Family History  Problem Relation Age of Onset   Diabetes  Mother    Healthy Father     Current Facility-Administered Medications  Medication Dose Route Frequency Provider Last Rate Last Admin   ceFAZolin (ANCEF) IVPB 2g/100 mL premix  2 g Intravenous On Call to OR Colon Shove, MD       Chlorhexidine Gluconate Cloth 2 % PADS 6 each  6 each Topical Once Colon Shove, MD       And   Chlorhexidine Gluconate Cloth 2 % PADS 6 each  6 each Topical Once Colon Shove, MD       Chlorhexidine Gluconate Cloth 2 % PADS 6 each  6 each Topical Once Shonta Bourque E, MD       And   Chlorhexidine Gluconate Cloth 2 % PADS 6 each  6 each Topical Once Darryll Raju E, MD       lactated ringers infusion   Intravenous Continuous Leonce Athens, MD       remifentanil (ULTIVA) 2 mg in  100 mL normal saline (20 mcg/mL) Optime  0.15 mcg/kg/min Intravenous To OR Mollie Olivia SAUNDERS, CRNA       scopolamine (TRANSDERM-SCOP) 1 MG/3DAYS 1 mg  1 patch Transdermal Q72H Leonce Athens, MD   1 mg at 03/13/24 9386    Allergies  Allergen Reactions   Peanuts [Peanut  Oil] Nausea And Vomiting    Also allergic to tree nuts.   Amoxicillin Rash   Penicillins Rash     REVIEW OF SYSTEMS:  [X]  denotes positive finding, [ ]  denotes negative finding Cardiac  Comments:  Chest pain or chest pressure:    Shortness of breath upon exertion:    Short of breath when lying flat:    Irregular heart rhythm:        Vascular    Pain in calf, thigh, or hip brought on by ambulation:    Pain in feet at night that wakes you up from your sleep:     Blood clot in your veins:    Leg swelling:         Pulmonary    Oxygen at home:    Productive cough:     Wheezing:         Neurologic    Sudden weakness in arms or legs:     Sudden numbness in arms or legs:     Sudden onset of difficulty speaking or slurred speech:    Temporary loss of vision in one eye:     Problems with dizziness:         Gastrointestinal    Blood in stool:     Vomited blood:         Genitourinary    Burning when urinating:     Blood in urine:        Psychiatric    Major depression:         Hematologic    Bleeding problems:    Problems with blood clotting too easily:        Skin    Rashes or ulcers:        Constitutional    Fever or chills:      PHYSICAL EXAMINATION:  Vitals:   03/13/24 0608  BP: (!) 104/59  Pulse: 72  Resp: 17  Temp: 98.2 F (36.8 C)  TempSrc: Oral  SpO2: 98%  Weight: 56.7 kg  Height: 5' 2 (1.575 m)    General:  WDWN in NAD; vital signs documented above Gait: Not observed HENT: WNL, normocephalic Pulmonary: normal non-labored breathing , without wheezing Cardiac:  regular HR Abdomen: soft, NT, no masses Skin: without rashes Vascular Exam/Pulses:  Right Left  Radial 2+  (normal) 2+ (normal)  Ulnar    Femoral    Popliteal    DP 2+ (normal) 2+ (normal)  PT     Extremities: without ischemic changes, without Gangrene , without cellulitis; without open wounds;  Musculoskeletal: no muscle wasting or atrophy  Neurologic: A&O X 3;  No focal weakness or paresthesias are detected Psychiatric:  The pt has Normal affect.   Non-Invasive Vascular Imaging:   See MRI    ASSESSMENT/PLAN: Taylor Rose is a 23 y.o. female presenting with left lower extremity lumbar radiculopathy with plans for L4-L5 ALIF next week with Dr. Colon.  Imaging was reviewed.  The L4-L5 disc space appears to be accessible.  I had a long discussion with Taylor Rose and her mother regarding the risks and benefits of surgery.  We discussed bleeding, damage to adjacent structures, hernia, nerve damage amongst others.  After discussing the risks and benefits in length, Taylor Rose elected to proceed.    Taylor FORBES Rim, MD [facility] [phone]

## 2024-03-13 NOTE — Transfer of Care (Signed)
 Immediate Anesthesia Transfer of Care Note  Patient: Taylor Rose  Procedure(s) Performed: ANTERIOR LUMBAR INTERBODY FUSION LUMBAR FOUR-LUMBAR FIVE ABDOMINAL EXPOSURE FOR SPINE SURGERY  Patient Location: PACU  Anesthesia Type:General  Level of Consciousness: drowsy  Airway & Oxygen Therapy: Patient Spontanous Breathing and Patient connected to face mask oxygen  Post-op Assessment: Report given to RN  Post vital signs: Reviewed, stable, and unstable  Last Vitals:  Vitals Value Taken Time  BP 102/60 03/13/24 13:38  Temp    Pulse 108 03/13/24 13:39  Resp 17 03/13/24 13:39  SpO2 100 % 03/13/24 13:39  Vitals shown include unfiled device data.  Last Pain:  Vitals:   03/13/24 0608  TempSrc: Oral         Complications: No notable events documented.

## 2024-03-13 NOTE — Anesthesia Postprocedure Evaluation (Signed)
 Anesthesia Post Note  Patient: Taylor Rose  Procedure(s) Performed: ANTERIOR LUMBAR INTERBODY FUSION LUMBAR FOUR-LUMBAR FIVE ABDOMINAL EXPOSURE FOR SPINE SURGERY     Patient location during evaluation: PACU Anesthesia Type: General Level of consciousness: awake and alert, patient cooperative and oriented Pain management: pain level controlled Vital Signs Assessment: post-procedure vital signs reviewed and stable Respiratory status: spontaneous breathing, nonlabored ventilation and respiratory function stable Cardiovascular status: blood pressure returned to baseline and stable Postop Assessment: no apparent nausea or vomiting Anesthetic complications: no   No notable events documented.  Last Vitals:  Vitals:   03/13/24 1430 03/13/24 1445  BP: 100/66 96/63  Pulse: 87 69  Resp: 14 (!) 21  Temp:  36.7 C  SpO2: 97% 98%    Last Pain:  Vitals:   03/13/24 1445  TempSrc:   PainSc: 0-No pain                 Venna Berberich,E. Jakaiden Fill

## 2024-03-13 NOTE — Anesthesia Procedure Notes (Addendum)
 Procedure Name: Intubation Date/Time: 03/13/2024 8:04 AM  Performed by: Mollie Olivia SAUNDERS, CRNAPre-anesthesia Checklist: Patient identified, Emergency Drugs available, Suction available and Patient being monitored Patient Re-evaluated:Patient Re-evaluated prior to induction Oxygen Delivery Method: Circle system utilized Preoxygenation: Pre-oxygenation with 100% oxygen Induction Type: IV induction Ventilation: Mask ventilation without difficulty Laryngoscope Size: Mac and 3 Grade View: Grade I Tube type: Oral Tube size: 7.0 mm Number of attempts: 1 Airway Equipment and Method: Stylet Placement Confirmation: ETT inserted through vocal cords under direct vision, positive ETCO2 and breath sounds checked- equal and bilateral Secured at: 22 cm Tube secured with: Tape Dental Injury: Teeth and Oropharynx as per pre-operative assessment

## 2024-03-13 NOTE — H&P (Signed)
 Taylor Rose is an 23 y.o. female.   Chief Complaint: Back and bilateral leg pain HPI: Current herniated nucleus pulposus L4-L5.  Taylor Rose is a 23 year old individual who has had significant back and bilateral lower extremity pain she initially presented with a herniated nucleus pulposus at L5-S1 after having considerable pain for over a years period of time and failing every effort of conservative therapy she underwent a discectomy she did well for a couple of years but then developed pain at L4-L5 with a herniated nucleus pulposus there she underwent a discectomy and this seemed to improve her symptoms but here lately she has had recurrent pain in initially the left leg and down to the right leg and is having considerable difficulties I have advised decompression of the L4-L5 space with an anterior interbody arthrodesis.  L5-S1 demonstrates that the disc is degenerated there is no evidence of any lateral recess or nerve root compromise.  We are doing a single level anterior interbody arthrodesis at L4-L5.    Past Medical History:  Diagnosis Date   Acanthosis nigricans    ADHD (attention deficit hyperactivity disorder)    Allergic rhinitis    Allergy  to peanuts    and tree nuts   Anxiety    Asthma    mild   Attention deficit disorder predominant inattentive type    Depression    History of nicotine dependence    Irregular periods/menstrual cycles    Learning difficulty involving mathematics    Migraine    Obesity    Pneumonia 2010   x 1   Seasonal allergies     Past Surgical History:  Procedure Laterality Date   LUMBAR MICRODISCECTOMY     x 2 surgeries at [facility]   WISDOM TOOTH EXTRACTION      Family History  Problem Relation Age of Onset   Diabetes Mother    Healthy Father    Social History:  reports that she has never smoked. She has never used smokeless tobacco. She reports current alcohol use of about 2.0 standard drinks of alcohol per week. She  reports that she does not use drugs.  Allergies:  Allergies  Allergen Reactions   Peanuts [Peanut  Oil] Nausea And Vomiting    Also allergic to tree nuts.   Amoxicillin Rash   Penicillins Rash    Medications Prior to Admission  Medication Sig Dispense Refill   albuterol  (PROVENTIL  HFA) 108 (90 Base) MCG/ACT inhaler Inhale 2 puffs into the lungs every 6 (six) hours as needed for wheezing or shortness of breath. 1 Inhaler 1   amphetamine-dextroamphetamine (ADDERALL XR) 20 MG 24 hr capsule Take 40 mg by mouth daily.     amphetamine-dextroamphetamine (ADDERALL) 10 MG tablet Take 10 mg by mouth every evening.     buPROPion (WELLBUTRIN XL) 300 MG 24 hr tablet Take 300 mg by mouth daily.     DULoxetine (CYMBALTA) 60 MG capsule Take 60 mg by mouth daily.     levonorgestrel (MIRENA) 20 MCG/DAY IUD 1 each by Intrauterine route once.     albuterol  (PROVENTIL ) (2.5 MG/3ML) 0.083% nebulizer solution Take 3 mLs (2.5 mg total) by nebulization every 4 (four) hours as needed for wheezing or shortness of breath. 75 mL 0   IPRATROPIUM BROMIDE  NA Place 2 sprays into both nostrils 4 (four) times daily.     Respiratory Therapy Supplies (NEBULIZER/TUBING/MOUTHPIECE) KIT Use once every 4-6 hours as needed with nebulized medication. 1 kit 0    Results for orders placed  or performed during the hospital encounter of 03/13/24 (from the past 48 hours)  Pregnancy, urine POC     Status: None   Collection Time: 03/13/24  6:57 AM  Result Value Ref Range   Preg Test, Ur NEGATIVE NEGATIVE    Comment:        THE SENSITIVITY OF THIS METHODOLOGY IS >20 mIU/mL.    No results found.  Review of Systems  Constitutional:  Positive for activity change.  Musculoskeletal:  Positive for back pain, gait problem and myalgias.  Neurological:  Positive for weakness and numbness.  All other systems reviewed and are negative.   Blood pressure (!) 104/59, pulse 72, temperature 98.2 F (36.8 C), temperature source Oral, resp.  rate 17, height 5' 2 (1.575 m), weight 56.7 kg, SpO2 98%. Physical Exam Constitutional:      Appearance: Normal appearance. She is normal weight.  HENT:     Head: Normocephalic and atraumatic.     Right Ear: Tympanic membrane, ear canal and external ear normal.     Left Ear: Tympanic membrane, ear canal and external ear normal.     Nose: Nose normal.     Mouth/Throat:     Mouth: Mucous membranes are moist.     Pharynx: Oropharynx is clear.  Eyes:     Extraocular Movements: Extraocular movements intact.     Conjunctiva/sclera: Conjunctivae normal.     Pupils: Pupils are equal, round, and reactive to light.  Cardiovascular:     Rate and Rhythm: Normal rate and regular rhythm.     Pulses: Normal pulses.     Heart sounds: Normal heart sounds.  Pulmonary:     Effort: Pulmonary effort is normal.     Breath sounds: Normal breath sounds.  Abdominal:     General: Abdomen is flat. Bowel sounds are normal.     Palpations: Abdomen is soft.  Musculoskeletal:        General: Normal range of motion.     Cervical back: Normal range of motion.  Skin:    General: Skin is warm and dry.     Capillary Refill: Capillary refill takes less than 2 seconds.  Neurological:     Mental Status: She is alert.     Comments: Mild weakness in tibialis anterior 4+ out of 5 on the left 4 out of 5 on the right.  Positive straight leg raising bilaterally at 30 degrees.  Patrick's maneuver is negative bilaterally  Psychiatric:        Mood and Affect: Mood normal.        Behavior: Behavior normal.        Thought Content: Thought content normal.        Judgment: Judgment normal.      Assessment/Plan Recurrent herniated nucleus pulposus L4-L5.  Plan anterior lumbar interbody decompression arthrodesis L4-L5.  Taylor JINNY Gens, MD 03/13/2024, 7:37 AM

## 2024-03-13 NOTE — Op Note (Signed)
    NAME: Taylor Rose    MRN: [id number] DOB: Aug 16, 2000    DATE OF OPERATION: 03/13/2024  PREOP DIAGNOSIS:    Lumbar radiculopathy  POSTOP DIAGNOSIS:    Same   PROCEDURE:    Anterior exposure of the L4/L5 disc space for anterior lumbar interbody fusion  SURGEON: Fonda FORBES Rim Co-surgeon: Dr. Victory Gens Assistant: Dr. Lonni Gaskins  ANESTHESIA: General  EBL: 25 mL  INDICATIONS:    [name] is a 23 y.o. female with history of multiple lumbar disc herniations.  She has undergone microdiscectomy x 2 and presents today for L4-L5 anterior lumbar interbody fusion.  I was tasked with safe exposure of the L4-L5 disc space.  FINDINGS:   Exposure of the L4-L5 disc space confirmed with fluoroscopy  TECHNIQUE:   Patient was brought to the OR laid in the supine position.  General anesthesia was induced and the patient was prepped and draped in standard fashion.  The case began with marking the L4-L5 disc space using fluoroscopy.  This was done in the lateral projection.  Once marked, the dermis was anesthetized with use of local anesthetic.  A longitudinal incision made on the abdomen to the left of the umbilicus which was carried down to the anterior rectus fascia.  From the anterior rectus fascia, I entered the retroperitoneum inferolateral, and worked to create a plane using blunt dissection to the posterior rectus sheath.  Once the plane was created, Metzenbaum scissors were used to take down the posterior rectus sheath.  Once down, I used blunt dissection to navigate to the psoas muscle.  Nerves were appreciated at the base of the wound bed.  These were left posteriorly within the retroperitoneum.    I identified the spine, and then worked to mobilize the artery and vein.  With the assistance of Dr. Gaskins, I was able to free the artery from overlying tissue to ensure there were no segmental branches.  There was one that required ligation using 2-0 silk  suture.  I ensured that the ureter was retracted out of the field, and was uninjured.  Next, I moved to expose the vein.  There were 2 iliolumbar branches that were ligated using 2-0 silk suture.  This allowed for safe mobilization of the artery and vein.  I was able to stay directly on the disc space removing the artery and vein to the patient's right.  Next, NuVasive retractor was placed I used two 140 mm reverse-lip retractors placed to the left and right, followed by two 120 mm reverse lip retractors placed cranial and caudad retractors.  Dr. Gens was happy with this exposure.  A spinal needle was placed in the L4-L5 to space to confirm that we were at the right level.  We were.  I then turned the case over to Dr. Gens  Fonda FORBES Rim, MD [facility] DATE OF DICTATION:   03/13/2024

## 2024-03-14 ENCOUNTER — Encounter (HOSPITAL_COMMUNITY): Payer: Self-pay | Admitting: Neurological Surgery

## 2024-03-14 MED ORDER — OXYCODONE-ACETAMINOPHEN 5-325 MG PO TABS: 1.0000 | ORAL_TABLET | ORAL | 0 refills | Status: AC | PRN

## 2024-03-14 MED ORDER — METHOCARBAMOL 500 MG PO TABS: 500.0000 mg | ORAL_TABLET | Freq: Four times a day (QID) | ORAL | 2 refills | Status: AC | PRN

## 2024-03-14 MED FILL — Heparin Sodium (Porcine) Inj 1000 Unit/ML: INTRAMUSCULAR | Qty: 60 | Status: AC

## 2024-03-14 MED FILL — Sodium Chloride IV Soln 0.9%: INTRAVENOUS | Qty: 1000 | Status: AC

## 2024-03-14 NOTE — Plan of Care (Signed)
Pt doing well. Pt and mother given D/C instructions with verbal understanding. Rx's were sent to the pharmacy by MD. Pt's incision is clean and dry with no sign of infection. Pt's IV was removed prior to D/C. Pt D/C'd home via wheelchair per MD order. Pt is stable @ D/C and has no other needs at this time. Makendra Vigeant, RN ?

## 2024-03-14 NOTE — Discharge Summary (Signed)
 Physician Discharge Summary  Patient ID: Taylor Rose MRN: [id number] DOB/AGE: 25/25/2002 25 y.o.  Admit date: 03/13/2024 Discharge date: 03/14/2024  Admission Diagnoses: Recurrent herniated nucleus pulposus L4-L5 with left lumbar radiculopathy  Discharge Diagnoses: Recurrent herniated nucleus pulposus L4-L5.  Left lumbar radiculopathy. Principal Problem:   Herniated nucleus pulposus, L4-5   Discharged Condition: good  Hospital Course: Patient underwent anterior decompression and arthrodesis which she tolerated well.  Consults: vascular surgery  Significant Diagnostic Studies: None  Treatments: surgery: See op note  Discharge Exam: Blood pressure 104/73, pulse 81, temperature 98.3 F (36.8 C), temperature source Oral, resp. rate 17, height 5' 2 (1.575 m), weight 56.7 kg, SpO2 100%. Incision is clean and dry Station and gait are intact.  Disposition: Discharge disposition: 01-Home or Self Care       Discharge Instructions     Diet - low sodium heart healthy   Complete by: As directed    Discharge wound care:   Complete by: As directed    Okay to shower. Do not apply salves or appointments to incision. No heavy lifting with the upper extremities greater than 10 pounds. May resume driving in three weeks, when off all pain meds.   Incentive spirometry RT   Complete by: As directed    Increase activity slowly   Complete by: As directed       Allergies as of 03/14/2024       Reactions   Peanuts [peanut  Oil] Nausea And Vomiting   Also allergic to tree nuts.   Amoxicillin Rash   Penicillins Rash        Medication List     TAKE these medications    albuterol  108 (90 Base) MCG/ACT inhaler Commonly known as: Proventil  HFA Inhale 2 puffs into the lungs every 6 (six) hours as needed for wheezing or shortness of breath.   albuterol  (2.5 MG/3ML) 0.083% nebulizer solution Commonly known as: PROVENTIL  Take 3 mLs (2.5 mg total) by nebulization every 4  (four) hours as needed for wheezing or shortness of breath.   amphetamine-dextroamphetamine 20 MG 24 hr capsule Commonly known as: ADDERALL XR Take 40 mg by mouth daily.   amphetamine-dextroamphetamine 10 MG tablet Commonly known as: ADDERALL Take 10 mg by mouth every evening.   buPROPion 300 MG 24 hr tablet Commonly known as: WELLBUTRIN XL Take 300 mg by mouth daily.   DULoxetine 60 MG capsule Commonly known as: CYMBALTA Take 60 mg by mouth daily.   IPRATROPIUM BROMIDE  NA Place 2 sprays into both nostrils 4 (four) times daily.   levonorgestrel 20 MCG/DAY Iud Commonly known as: MIRENA 1 each by Intrauterine route once.   methocarbamol 500 MG tablet Commonly known as: ROBAXIN Take 1 tablet (500 mg total) by mouth every 6 (six) hours as needed for muscle spasms.   Nebulizer/Tubing/Mouthpiece Kit Use once every 4-6 hours as needed with nebulized medication.   oxyCODONE -acetaminophen  5-325 MG tablet Commonly known as: PERCOCET/ROXICET Take 1-2 tablets by mouth every 4 (four) hours as needed for moderate pain (pain score 4-6) or severe pain (pain score 7-10).               Discharge Care Instructions  (From admission, onward)           Start     Ordered   03/14/24 0000  Discharge wound care:       Comments: Okay to shower. Do not apply salves or appointments to incision. No heavy lifting with the upper extremities greater than 10 pounds.  May resume driving in three weeks, when off all pain meds.   03/14/24 1211             Signed: Victory PARAS Syria Kestner 03/14/2024, 12:11 PM

## 2024-03-14 NOTE — Progress Notes (Signed)
 PT Cancellation Note and Discharge  Patient Details Name: Taylor Rose MRN: [id number] DOB: [birth year]   Cancelled Treatment:    Reason Eval/Treat Not Completed: PT screened, no needs identified, will sign off. Observed pt ambulating at a mod I level with OT in hallway. Discussed pt case with RN who reports pt has been independent in room and does not require a formal PT evaluation at this time. PT signing off. If needs change, please reconsult.     Leita JONETTA Sable 03/14/2024, 10:05 AM  Leita Sable, PT, DPT Acute Rehabilitation Services Secure Chat Preferred Office: [phone]

## 2024-03-14 NOTE — Evaluation (Signed)
 Occupational Therapy Evaluation Patient Details Name: Taylor Rose MRN: [id number] DOB: 04/05/2001 Today's Date: 03/14/2024   History of Present Illness   Pt is a 23 y.o. female s/p ALIF L4-5. PMH: ADHD, Acanthosis nigricans, anxiety, asthma, ADD, depression, irregular periods, learning difficulty involving math, back surgery x 2.     Clinical Impressions PTA, pt lived alone and was independent. Upon eval, pt performing UB ADL with mod I and LB ADL with up to min A. Pt educated and demonstrating use of compensatory techniques for bed mobility, LB ADL, grooming, toileting, brace application and shower transfers within precautions. Mother to stay with pt for first several weeks after surgery as needed. All education provided and questions answered. Recommending discharge home with no OT follow up at this time. OT to sign off. Please re-consult if change in status.        If plan is discharge home, recommend the following:   A little help with walking and/or transfers;A little help with bathing/dressing/bathroom;Assistance with cooking/housework;Assist for transportation;Help with stairs or ramp for entrance     Functional Status Assessment   Patient has had a recent decline in their functional status and demonstrates the ability to make significant improvements in function in a reasonable and predictable amount of time.     Equipment Recommendations   None recommended by OT     Recommendations for Other Services         Precautions/Restrictions   Precautions Precautions: Back Precaution Booklet Issued: Yes (comment) Recall of Precautions/Restrictions: Intact Required Braces or Orthoses: Spinal Brace Spinal Brace: Applied in sitting position;Lumbar corset Restrictions Weight Bearing Restrictions Per Provider Order: No     Mobility Bed Mobility Overal bed mobility: Modified Independent             General bed mobility comments: good demo of log roll  technique, provided cues to optimize comfort and technique for return to supine using log roll technique and pt performing without assist    Transfers Overall transfer level: Modified independent Equipment used: None                      Balance Overall balance assessment: Mild deficits observed, not formally tested (preference for RW use throughout session during gait)                                         ADL either performed or assessed with clinical judgement   ADL Overall ADL's : Needs assistance/impaired Eating/Feeding: Independent   Grooming: Standing;Modified independent   Upper Body Bathing: Modified independent;Sitting   Lower Body Bathing: Minimal assistance;Sit to/from stand   Upper Body Dressing : Modified independent;Sitting   Lower Body Dressing: Minimal assistance;Sit to/from stand   Toilet Transfer: Modified Independent           Functional mobility during ADLs: Modified independent;Rolling walker (2 wheels) General ADL Comments: pt with preference for RW use     Vision         Perception Perception: Within Functional Limits       Praxis Praxis: [facility]       Pertinent Vitals/Pain Pain Assessment Pain Assessment: 0-10 Pain Score: 7  Pain Location: abdominal incision, back Pain Descriptors / Indicators: Operative site guarding Pain Intervention(s): Limited activity within patient's tolerance, Monitored during session     Extremity/Trunk Assessment Upper Extremity Assessment Upper Extremity Assessment: Overall WFL for tasks  assessed   Lower Extremity Assessment Lower Extremity Assessment: Defer to PT evaluation   Cervical / Trunk Assessment Cervical / Trunk Assessment: Back Surgery   Communication Communication Communication: No apparent difficulties   Cognition Arousal: Alert Behavior During Therapy: WFL for tasks assessed/performed Cognition: No apparent impairments                                Following commands: Intact       Cueing  General Comments   Cueing Techniques: Verbal cues      Exercises     Shoulder Instructions      Home Living Family/patient expects to be discharged to:: Private residence Living Arrangements: Alone Available Help at Discharge: Family;Other (Comment) (mother to stay for the first few weeks as needed) Type of Home: Apartment Home Access: Elevator (pt lives on the 5th floor by the elevator)     Home Layout: One level     Bathroom Shower/Tub: Walk-in shower (no lip)   Bathroom Toilet: Standard     Home Equipment: Agricultural Consultant (2 wheels);BSC/3in1          Prior Functioning/Environment Prior Level of Function : Independent/Modified Independent                    OT Problem List: Decreased strength;Decreased activity tolerance;Decreased knowledge of precautions;Decreased knowledge of use of DME or AE;Pain   OT Treatment/Interventions:        OT Goals(Current goals can be found in the care plan section)   Acute Rehab OT Goals Patient Stated Goal: get better OT Goal Formulation: With patient Time For Goal Achievement: 03/28/24 Potential to Achieve Goals: Good   OT Frequency:       Co-evaluation              AM-PAC OT 6 Clicks Daily Activity     Outcome Measure Help from another person eating meals?: None Help from another person taking care of personal grooming?: None Help from another person toileting, which includes using toliet, bedpan, or urinal?: A Little Help from another person bathing (including washing, rinsing, drying)?: A Little Help from another person to put on and taking off regular upper body clothing?: None Help from another person to put on and taking off regular lower body clothing?: A Little 6 Click Score: 21   End of Session Equipment Utilized During Treatment: Rolling walker (2 wheels);Back brace Nurse Communication: Mobility status  Activity Tolerance: Patient  tolerated treatment well Patient left: in bed;with call bell/phone within reach;with family/visitor present  OT Visit Diagnosis: Muscle weakness (generalized) (M62.81);Pain                Time: 9160-9099 OT Time Calculation (min): 21 min Charges:  OT General Charges $OT Visit: 1 Visit OT Evaluation $OT Eval Low Complexity: 1 Low  Taylor JONETTA Lebron FREDERICK, OTR/L [facility] Acute Rehabilitation Office: [phone]  Taylor JONETTA Lebron 03/14/2024, 9:25 AM

## 2024-03-14 NOTE — Progress Notes (Addendum)
  Daily Progress Note  S/p: ALIF  Subjective: Doing well this morning, notes some numbness to the anterior aspect of the left thigh  Objective: Vitals:   03/14/24 0405 03/14/24 0821  BP: 100/66 104/73  Pulse: 67 81  Resp: 16 17  Temp: 99.2 F (37.3 C) 98.3 F (36.8 C)  SpO2: 100% 100%    Physical Examination Palpable pulses in the extremities No asymmetry in the legs Soft calves Incision clean dry Cutaneous numbness of the left anterior thigh  ASSESSMENT/PLAN:  Patient is a 23 year old female status post L4-L5 ALIF.  Overall doing well. Cutaneous numbness in the genitofemoral nerve distribution.     Taylor FORBES Rim MD MS [facility] [phone] 03/14/2024  9:12 AM

## 2024-03-15 MED FILL — Sodium Chloride IV Soln 0.9%: INTRAVENOUS | Qty: 1000 | Status: AC

## 2024-03-16 NOTE — OR Nursing (Signed)
 Addendum to count radiology taken at closing report called to room by radiology no foreign body noted

## 2024-05-03 ENCOUNTER — Other Ambulatory Visit (HOSPITAL_BASED_OUTPATIENT_CLINIC_OR_DEPARTMENT_OTHER): Payer: Self-pay

## 2024-05-03 MED ORDER — EPINEPHRINE 0.3 MG/0.3ML IJ SOAJ
0.3000 mg | Freq: Once | INTRAMUSCULAR | 1 refills | Status: AC
  Filled 2024-05-03: qty 2, 30d supply, fill #0

## 2024-05-05 ENCOUNTER — Other Ambulatory Visit (HOSPITAL_BASED_OUTPATIENT_CLINIC_OR_DEPARTMENT_OTHER): Payer: Self-pay

## 2024-05-15 ENCOUNTER — Other Ambulatory Visit (HOSPITAL_BASED_OUTPATIENT_CLINIC_OR_DEPARTMENT_OTHER): Payer: Self-pay
